# Patient Record
Sex: Male | Born: 1960 | Race: White | Hispanic: No | Marital: Married | Smoking: Never smoker
Health system: Southern US, Community
[De-identification: ages and names within clinical notes are randomized; demographics above are authoritative.]

## PROBLEM LIST (undated history)

## (undated) ENCOUNTER — Ambulatory Visit: Admission: EM | Payer: BC Managed Care – PPO | Source: Home / Self Care

## (undated) DIAGNOSIS — M533 Sacrococcygeal disorders, not elsewhere classified: Secondary | ICD-10-CM

## (undated) DIAGNOSIS — T8859XA Other complications of anesthesia, initial encounter: Secondary | ICD-10-CM

## (undated) DIAGNOSIS — E785 Hyperlipidemia, unspecified: Secondary | ICD-10-CM

## (undated) DIAGNOSIS — M545 Low back pain, unspecified: Secondary | ICD-10-CM

## (undated) DIAGNOSIS — M1990 Unspecified osteoarthritis, unspecified site: Secondary | ICD-10-CM

## (undated) DIAGNOSIS — I10 Essential (primary) hypertension: Secondary | ICD-10-CM

## (undated) DIAGNOSIS — K219 Gastro-esophageal reflux disease without esophagitis: Secondary | ICD-10-CM

## (undated) DIAGNOSIS — K760 Fatty (change of) liver, not elsewhere classified: Secondary | ICD-10-CM

## (undated) DIAGNOSIS — Z9889 Other specified postprocedural states: Secondary | ICD-10-CM

## (undated) HISTORY — PX: FOOT SURGERY: SHX648

## (undated) HISTORY — PX: JOINT REPLACEMENT: SHX530

## (undated) HISTORY — DX: Low back pain: M54.5

## (undated) HISTORY — DX: Gastro-esophageal reflux disease without esophagitis: K21.9

## (undated) HISTORY — DX: Sacrococcygeal disorders, not elsewhere classified: M53.3

## (undated) HISTORY — PX: WRIST SURGERY: SHX841

## (undated) HISTORY — PX: KNEE SURGERY: SHX244

## (undated) HISTORY — PX: COLONOSCOPY: SHX174

## (undated) HISTORY — DX: Hyperlipidemia, unspecified: E78.5

## (undated) HISTORY — DX: Fatty (change of) liver, not elsewhere classified: K76.0

## (undated) HISTORY — DX: Low back pain, unspecified: M54.50

---

## 2005-12-29 ENCOUNTER — Emergency Department: Payer: Self-pay | Admitting: Emergency Medicine

## 2005-12-29 ENCOUNTER — Other Ambulatory Visit: Payer: Self-pay

## 2006-01-26 ENCOUNTER — Emergency Department: Payer: Self-pay | Admitting: Emergency Medicine

## 2006-02-21 ENCOUNTER — Emergency Department: Payer: Self-pay | Admitting: Emergency Medicine

## 2006-02-22 ENCOUNTER — Emergency Department: Payer: Self-pay | Admitting: Emergency Medicine

## 2006-02-23 ENCOUNTER — Other Ambulatory Visit: Payer: Self-pay

## 2006-02-27 ENCOUNTER — Ambulatory Visit: Payer: Self-pay | Admitting: Orthopedic Surgery

## 2006-02-28 ENCOUNTER — Emergency Department: Payer: Self-pay | Admitting: Emergency Medicine

## 2006-03-01 ENCOUNTER — Emergency Department: Payer: Self-pay | Admitting: Emergency Medicine

## 2007-02-18 ENCOUNTER — Emergency Department: Payer: Self-pay | Admitting: Emergency Medicine

## 2007-05-07 ENCOUNTER — Ambulatory Visit: Payer: Self-pay | Admitting: Internal Medicine

## 2007-07-29 ENCOUNTER — Emergency Department: Payer: Self-pay | Admitting: Emergency Medicine

## 2007-08-02 ENCOUNTER — Emergency Department: Payer: Self-pay | Admitting: Emergency Medicine

## 2007-11-11 ENCOUNTER — Emergency Department: Payer: Self-pay | Admitting: Emergency Medicine

## 2007-12-04 ENCOUNTER — Ambulatory Visit: Payer: Self-pay | Admitting: Family Medicine

## 2008-01-10 ENCOUNTER — Emergency Department: Payer: Self-pay | Admitting: Emergency Medicine

## 2009-04-16 ENCOUNTER — Emergency Department (HOSPITAL_BASED_OUTPATIENT_CLINIC_OR_DEPARTMENT_OTHER): Admission: EM | Admit: 2009-04-16 | Discharge: 2009-04-16 | Payer: Self-pay | Admitting: Emergency Medicine

## 2009-04-19 ENCOUNTER — Emergency Department (HOSPITAL_COMMUNITY): Admission: EM | Admit: 2009-04-19 | Discharge: 2009-04-19 | Payer: Self-pay | Admitting: Emergency Medicine

## 2009-06-22 ENCOUNTER — Inpatient Hospital Stay (HOSPITAL_COMMUNITY): Admission: EM | Admit: 2009-06-22 | Discharge: 2009-06-24 | Payer: Self-pay | Admitting: Emergency Medicine

## 2009-06-22 ENCOUNTER — Ambulatory Visit: Payer: Self-pay | Admitting: Cardiology

## 2009-06-23 ENCOUNTER — Encounter (INDEPENDENT_AMBULATORY_CARE_PROVIDER_SITE_OTHER): Payer: Self-pay | Admitting: Internal Medicine

## 2009-06-23 ENCOUNTER — Ambulatory Visit: Payer: Self-pay | Admitting: Vascular Surgery

## 2010-01-17 ENCOUNTER — Emergency Department: Payer: Self-pay | Admitting: Emergency Medicine

## 2010-01-18 ENCOUNTER — Emergency Department: Payer: Self-pay | Admitting: Unknown Physician Specialty

## 2010-01-23 ENCOUNTER — Emergency Department: Payer: Self-pay | Admitting: Emergency Medicine

## 2010-05-16 ENCOUNTER — Ambulatory Visit: Payer: Self-pay | Admitting: Family Medicine

## 2010-06-24 ENCOUNTER — Ambulatory Visit: Payer: Self-pay | Admitting: Physician Assistant

## 2010-09-07 ENCOUNTER — Ambulatory Visit: Payer: Self-pay | Admitting: Family Medicine

## 2010-09-07 ENCOUNTER — Encounter
Admission: RE | Admit: 2010-09-07 | Discharge: 2010-09-07 | Payer: Self-pay | Source: Home / Self Care | Admitting: Family Medicine

## 2010-11-01 ENCOUNTER — Emergency Department (HOSPITAL_COMMUNITY)
Admission: EM | Admit: 2010-11-01 | Discharge: 2010-11-01 | Payer: Self-pay | Source: Home / Self Care | Admitting: Emergency Medicine

## 2010-11-03 ENCOUNTER — Ambulatory Visit
Admission: RE | Admit: 2010-11-03 | Discharge: 2010-11-03 | Payer: Self-pay | Source: Home / Self Care | Attending: Family Medicine | Admitting: Family Medicine

## 2010-11-07 LAB — CBC
HCT: 41.4 % (ref 39.0–52.0)
Hemoglobin: 14.3 g/dL (ref 13.0–17.0)
MCH: 32.3 pg (ref 26.0–34.0)
MCHC: 34.5 g/dL (ref 30.0–36.0)
MCV: 93.5 fL (ref 78.0–100.0)
Platelets: 223 10*3/uL (ref 150–400)
RBC: 4.43 MIL/uL (ref 4.22–5.81)
RDW: 12.9 % (ref 11.5–15.5)
WBC: 8.6 10*3/uL (ref 4.0–10.5)

## 2010-11-07 LAB — BASIC METABOLIC PANEL
BUN: 6 mg/dL (ref 6–23)
CO2: 28 mEq/L (ref 19–32)
Calcium: 10.2 mg/dL (ref 8.4–10.5)
Chloride: 103 mEq/L (ref 96–112)
Creatinine, Ser: 0.97 mg/dL (ref 0.4–1.5)
GFR calc Af Amer: >60 mL/min (ref >60)
GFR calc non Af Amer: >60 mL/min (ref >60)
Glucose, Bld: 98 mg/dL (ref 70–99)
Potassium: 3.7 mEq/L (ref 3.5–5.1)
Sodium: 139 mEq/L (ref 135–145)

## 2010-11-07 LAB — DIFFERENTIAL
Basophils Absolute: 0 10*3/uL (ref 0.0–0.1)
Basophils Relative: 0 % (ref 0–1)
Eosinophils Absolute: 0.1 10*3/uL (ref 0.0–0.7)
Eosinophils Relative: 1 % (ref 0–5)
Lymphocytes Relative: 22 % (ref 12–46)
Lymphs Abs: 1.9 10*3/uL (ref 0.7–4.0)
Monocytes Absolute: 0.6 10*3/uL (ref 0.1–1.0)
Monocytes Relative: 7 % (ref 3–12)
Neutro Abs: 6 10*3/uL (ref 1.7–7.7)
Neutrophils Relative %: 70 % (ref 43–77)

## 2010-11-07 LAB — RAPID URINE DRUG SCREEN, HOSP PERFORMED
Amphetamines: NOT DETECTED
Barbiturates: NOT DETECTED
Benzodiazepines: NOT DETECTED
Cocaine: NOT DETECTED
Opiates: NOT DETECTED
Tetrahydrocannabinol: NOT DETECTED

## 2010-11-07 LAB — ETHANOL: Alcohol, Ethyl (B): <5 mg/dL (ref 0–10)

## 2010-11-08 ENCOUNTER — Ambulatory Visit: Admit: 2010-11-08 | Payer: Self-pay | Admitting: Family Medicine

## 2011-01-02 ENCOUNTER — Ambulatory Visit (INDEPENDENT_AMBULATORY_CARE_PROVIDER_SITE_OTHER): Payer: BC Managed Care – PPO | Admitting: Family Medicine

## 2011-01-02 ENCOUNTER — Ambulatory Visit
Admission: RE | Admit: 2011-01-02 | Discharge: 2011-01-02 | Disposition: A | Discharge status: Home / Self Care | Payer: BC Managed Care – PPO | Source: Ambulatory Visit | Attending: Family Medicine | Admitting: Family Medicine

## 2011-01-02 ENCOUNTER — Other Ambulatory Visit: Payer: Self-pay | Admitting: Family Medicine

## 2011-01-02 DIAGNOSIS — R52 Pain, unspecified: Secondary | ICD-10-CM

## 2011-01-02 DIAGNOSIS — M549 Dorsalgia, unspecified: Secondary | ICD-10-CM

## 2011-01-05 ENCOUNTER — Ambulatory Visit: Payer: BC Managed Care – PPO | Admitting: Family Medicine

## 2011-01-13 ENCOUNTER — Ambulatory Visit (INDEPENDENT_AMBULATORY_CARE_PROVIDER_SITE_OTHER): Payer: BC Managed Care – PPO | Admitting: Family Medicine

## 2011-01-13 DIAGNOSIS — M25569 Pain in unspecified knee: Secondary | ICD-10-CM

## 2011-01-13 DIAGNOSIS — M25469 Effusion, unspecified knee: Secondary | ICD-10-CM

## 2011-01-27 LAB — GLUCOSE, CAPILLARY: Glucose-Capillary: 102 mg/dL — ABNORMAL HIGH (ref 70–99)

## 2011-01-27 LAB — LIPID PANEL
Cholesterol: 127 mg/dL (ref 0–200)
HDL: 34 mg/dL — ABNORMAL LOW (ref >39)
LDL Cholesterol: 82 mg/dL (ref 0–99)
Total CHOL/HDL Ratio: 3.7 RATIO
Triglycerides: 55 mg/dL (ref <150)
VLDL: 11 mg/dL (ref 0–40)

## 2011-01-28 LAB — COMPREHENSIVE METABOLIC PANEL
ALT: 17 U/L (ref 0–53)
AST: 18 U/L (ref 0–37)
Albumin: 3.8 g/dL (ref 3.5–5.2)
Alkaline Phosphatase: 79 U/L (ref 39–117)
BUN: 7 mg/dL (ref 6–23)
CO2: 29 mEq/L (ref 19–32)
Calcium: 9 mg/dL (ref 8.4–10.5)
Chloride: 104 mEq/L (ref 96–112)
Creatinine, Ser: 1.02 mg/dL (ref 0.4–1.5)
GFR calc Af Amer: >60 mL/min (ref >60)
GFR calc non Af Amer: >60 mL/min (ref >60)
Glucose, Bld: 79 mg/dL (ref 70–99)
Potassium: 3.8 mEq/L (ref 3.5–5.1)
Sodium: 139 mEq/L (ref 135–145)
Total Bilirubin: 0.8 mg/dL (ref 0.3–1.2)
Total Protein: 6.3 g/dL (ref 6.0–8.3)

## 2011-01-28 LAB — MAGNESIUM: Magnesium: 2.1 mg/dL (ref 1.5–2.5)

## 2011-01-28 LAB — GLUCOSE, CAPILLARY: Glucose-Capillary: 127 mg/dL — ABNORMAL HIGH (ref 70–99)

## 2011-01-28 LAB — POCT I-STAT, CHEM 8
BUN: 7 mg/dL (ref 6–23)
Calcium, Ion: 1.18 mmol/L (ref 1.12–1.32)
Chloride: 102 mEq/L (ref 96–112)
Creatinine, Ser: 0.9 mg/dL (ref 0.4–1.5)
Glucose, Bld: 106 mg/dL — ABNORMAL HIGH (ref 70–99)
HCT: 43 % (ref 39.0–52.0)
Hemoglobin: 14.6 g/dL (ref 13.0–17.0)
Potassium: 4.1 mEq/L (ref 3.5–5.1)
Sodium: 137 mEq/L (ref 135–145)
TCO2: 27 mmol/L (ref 0–100)

## 2011-01-28 LAB — APTT: aPTT: 29 seconds (ref 24–37)

## 2011-01-28 LAB — HEMOGLOBIN A1C
Hgb A1c MFr Bld: 5.3 % (ref 4.6–6.1)
Mean Plasma Glucose: 105 mg/dL

## 2011-01-28 LAB — PROTIME-INR
INR: 1.1 (ref 0.00–1.49)
Prothrombin Time: 14.2 seconds (ref 11.6–15.2)

## 2011-01-28 LAB — TSH: TSH: 1.225 u[IU]/mL (ref 0.350–4.500)

## 2011-01-28 LAB — PHOSPHORUS: Phosphorus: 3.9 mg/dL (ref 2.3–4.6)

## 2011-01-30 LAB — CBC
HCT: 43.1 % (ref 39.0–52.0)
Hemoglobin: 14.7 g/dL (ref 13.0–17.0)
MCHC: 34.2 g/dL (ref 30.0–36.0)
MCV: 97.3 fL (ref 78.0–100.0)
Platelets: 211 10*3/uL (ref 150–400)
RBC: 4.43 MIL/uL (ref 4.22–5.81)
RDW: 12.4 % (ref 11.5–15.5)
WBC: 6.1 10*3/uL (ref 4.0–10.5)

## 2011-01-30 LAB — BASIC METABOLIC PANEL
BUN: 9 mg/dL (ref 6–23)
CO2: 30 mEq/L (ref 19–32)
Calcium: 9.2 mg/dL (ref 8.4–10.5)
Chloride: 102 mEq/L (ref 96–112)
Creatinine, Ser: 1 mg/dL (ref 0.4–1.5)
GFR calc Af Amer: >60 mL/min (ref >60)
GFR calc non Af Amer: >60 mL/min (ref >60)
Glucose, Bld: 102 mg/dL — ABNORMAL HIGH (ref 70–99)
Potassium: 3.9 mEq/L (ref 3.5–5.1)
Sodium: 140 mEq/L (ref 135–145)

## 2011-01-30 LAB — POCT CARDIAC MARKERS
CKMB, poc: <1 ng/mL — ABNORMAL LOW (ref 1.0–8.0)
Myoglobin, poc: 56.5 ng/mL (ref 12–200)
Troponin i, poc: <0.05 ng/mL (ref 0.00–0.09)

## 2011-01-30 LAB — DIFFERENTIAL
Basophils Absolute: 0 10*3/uL (ref 0.0–0.1)
Basophils Relative: 0 % (ref 0–1)
Eosinophils Absolute: 0 10*3/uL (ref 0.0–0.7)
Eosinophils Relative: 1 % (ref 0–5)
Lymphocytes Relative: 17 % (ref 12–46)
Lymphs Abs: 1 10*3/uL (ref 0.7–4.0)
Monocytes Absolute: 0.4 10*3/uL (ref 0.1–1.0)
Monocytes Relative: 6 % (ref 3–12)
Neutro Abs: 4.7 10*3/uL (ref 1.7–7.7)
Neutrophils Relative %: 76 % (ref 43–77)

## 2011-02-03 ENCOUNTER — Ambulatory Visit: Payer: BC Managed Care – PPO | Admitting: Family Medicine

## 2011-03-07 NOTE — Discharge Summary (Signed)
[name], [name]               ACCOUNT NO.:  [id number]   MEDICAL RECORD NO.:  [id number]          PATIENT TYPE:  INP   LOCATION:  3018                         FACILITY:  [facility]   PHYSICIAN:  Ladell Pier, M.D.   DATE OF BIRTH:  [birth year]   DATE OF ADMISSION:  06/22/2009  DATE OF DISCHARGE:  06/24/2009                               DISCHARGE SUMMARY   DISCHARGE DIAGNOSES:  1. Right-sided numbness and weakness, question of transient ischemic      attack.  2. Migraine headaches.  3. Gastroesophageal reflux disease.  4. 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   DISCHARGE MEDICATIONS:  1. Aspirin 325 mg daily.  2. Nexium 40 mg daily.   FOLLOWUP APPOINTMENT:  The patient to follow up with Dr. Conley Rolls at [facility] in 1 week.   PROCEDURES:  None.   CONSULTANTS:  None.   DIAGNOSTIC STUDIES:  MRI/MRA of the brain that showed no acute infarct,  chronic infarct in the left cerebellum, the left external capsule, and  left parietal periventricular white matter.  MRA negative for cerebral  aneurysm.  CT scan of the head done on the September 1, showed no  evidence of cortical based acute infarct, age indeterminate white matter  ischemia at the left external capsule, and chronic left cerebellar  infarct.  Carotid Dopplers showed no ICA stenosis.  A 2-D echo showed  left ventricular systolic function normal, EF of 60-65%.   HISTORY OF PRESENT ILLNESS:  The patient is a 50 year old white male  with past medical history significant for migraine headaches, came into  the emergency room complaining of numbness of the hand and forearm,  which eventually progressed to the foot and leg with accompanying  slurred speech for 20-30 minutes.  He also had headache at the time  related to his usual migraine headaches.  He had some nausea, but no  vomiting.  No chest pain.   Please see admission note for remainder of  history.  Past medical history, family history, social history, meds,  allergies, review of systems per admission H&P.   PHYSICAL EXAMINATION ON DISCHARGE:  VITAL SIGNS:  Temperature 97.7,  pulse of 65, respirations 18, blood pressure 98/66, and pulse oximetry  97% on room air.  GENERAL:  The patient is sitting up in bed, does not seem to be in any  acute distress.  No complaints.  HEENT:  Head is normocephalic and atraumatic.  Pupils reactive to light.  Throat without erythema.  HEART:  Regular rate and rhythm.  LUNGS:  Clear bilaterally.  ABDOMEN:  Positive bowel sounds.  EXTREMITIES:  Without edema.  NEUROLOGIC:  Nonfocal.   HOSPITAL COURSE:  1. Question of TIA/migraines:  The patient was admitted to the      hospital telemetry bed.  He was started on aspirin.  He did have      MRI/MRA, 2-D echo, and carotid Dopplers done for routine stroke      workup.  They were all without any significant findings for acute      event.  The patient will be discharged on aspirin, and he will      follow up with his primary care physician within 1 week.  2. Migraine headaches:  I discussed with him to discuss with his      primary care physician if he does have frequent migraines that he      could be on something prophylactic.  3. GERD:  Continued him on the Nexium.   DISCHARGE LABORATORY DATA:  TSH 1.225.  Hemoglobin A1c 5.3.  Total  cholesterol 127, triglycerides of 55, HDL 34, and LDL 82.  Magnesium  2.1.  Sodium 139, potassium 3.8, chloride 104, CO2 29, glucose 79, BUN  7, and creatinine 1.02.  LFTs within normal limits.   Time spent with the patient and doing the discharge is approximately 45  minutes.      Ladell Pier, M.D.  Electronically Signed     NJ/MEDQ  D:  06/24/2009  T:  06/25/2009  Job:  [id number]   cc:   Dr. Conley Rolls

## 2011-03-07 NOTE — H&P (Signed)
[name], [name]               ACCOUNT NO.:  [id number]   MEDICAL RECORD NO.:  [id number]          PATIENT TYPE:  INP   LOCATION:  3018                         FACILITY:  [facility]   PHYSICIAN:  Marinda Elk, M.D.DATE OF BIRTH:  12/21/1960   DATE OF ADMISSION:  06/22/2009  DATE OF DISCHARGE:                              HISTORY & PHYSICAL   PRIMARY CARE PHYSICIAN:  Ladell Pier, MD   HISTORY OF PRESENT ILLNESS:  This 50 year old with past medical history  of migraine that comes in for spots on his right eye that started around  10:30 then he relayed having numbness of the hand and forearm, which  eventually spread to the foot and leg with accompanying slurred speech  for 20 or 30 minutes.  He also had a headache with this, which he  relates is different from his usual migraine headaches.  This is the  first time this has ever happened.  He relates positive nausea, but no  vomiting.  He denies chest pain, shortness of breath, double vision,  dyspnea on exertion, lightheadedness, or dizziness.   ALLERGIES:  He has no known drug allergies.   PAST MEDICAL HISTORY:  Only significant for migraine headaches and GERD.   MEDICATION:  He takes Nexium 40 mg daily.   SOCIAL HISTORY:  He denies smoking, lives in [place], drinks beer 1-2  socially, denies any drugs.   FAMILY HISTORY:  Mother has hypertension, COPD, and heart failure.  His  father died at the age of 103 of PE.   REVIEW OF SYSTEMS:  GENERAL:  He denies fatigue or generalized weakness.  HEENT:  He denies any yellow eyes.  NECK:  He denies thyromegaly.  CARDIOVASCULAR:  No palpitations.  No chest pain.  LUNGS:  No cough, no  bloody sputum.  ABDOMEN:  No diarrhea, constipation, or blood in stools.  EXTREMITIES:  No deformities.  NEUROLOGIC:  No double vision, no  dizziness, no lightheadedness.   PHYSICAL EXAMINATION:  VITAL SIGNS:  Temperature 97.7, heart rate of 66,  respiration 18, blood pressure 127/73, he was  sating 98% on 2 L.  GENERAL APPEARANCE:  He appears in no acute distress.  HEENT:  Eyes are anicteric.  No pallor.  NECK:  No thyromegaly, slight bruit on the left carotid.  CARDIOVASCULAR:  He has a regular rate and rhythm.  Positive S1 and S2.  No murmurs, rubs, or gallops.  LUNGS:  Clear to auscultation with good air movement.  ABDOMEN:  Positive bowel sounds, nontender, nondistended, and soft.  EXTREMITIES:  Positive pulses.  No edema.  SKIN:  No significant changes.  NEURO:  He is alert and oriented x3, coherent, and fluent language.  Pupils are equally round and reactive to light.  No extraocular  movements.  Anicteric.  Intact throughout.  There is no facial droop.  He has 5/5 strength in all 4 extremities and symmetrically there is no  tongue deviation.  There is no slurred speech.  There is no footdrop.  Proprioception is intact.  Reflex is 2+ in upper and lower extremities.  Finger-to-nose is normal.   LABORATORY  DATA:  On admission, he has a white count of 6.1, hemoglobin  of 14.7, platelets was 211, ANC of 4.7, MCV of 97.  Sodium 140,  potassium 3.9, chloride 102, bicarb of 30, glucose of 102, BUN of 9,  creatinine 1.0, calcium of 9.2.  First set of point-of-cardiac care  markers, CK-MB less than 1.0, troponin is less than 0.05, myoglobin of  56.  Second set of point-of-cardiac care markers, CK-MB is less than 1,  troponin is less than 0.05, myoglobin of 64.  CT scan of the head shows  no evidence of cortical-based acute infarct, age indeterminate, white  matter ischemic on the left external capsule, and chronic left basilar  infarction.   ASSESSMENT/PLAN:  1. Right-sided numbness and weakness most likely secondary to      transient ischemic attack, now resolved.  We will admit the patient      to telemetry to monitor for any arrhythmia.  We will check a      swallowing evaluation.  We will start a statin and aspirin.  We      will check carotid Doppler, get fasting  lipid panel, TSH, PT/INR,      and a hemoglobin A1c.  The patient has a history of migraine, but      this he has never had episodes like this in the past.  He does      describe that the headache that he is having now is different from      migraine headaches he has had in the past.  We will also check UDS.  2. Migraine headaches.  We will continue him on Tylenol.  We will      monitor for any worsening of the headaches.  3. Prophylaxis.  We will start him on Protonix once a day daily and we      will start him also on Lovenox prophylactically.      Marinda Elk, M.D.  Electronically Signed     AF/MEDQ  D:  06/22/2009  T:  06/23/2009  Job:  [id number]   cc:   Ladell Pier, M.D.

## 2011-08-12 ENCOUNTER — Emergency Department (HOSPITAL_COMMUNITY)
Admission: EM | Admit: 2011-08-12 | Discharge: 2011-08-12 | Disposition: A | Discharge status: Home / Self Care | Payer: Self-pay | Attending: Emergency Medicine | Admitting: Emergency Medicine

## 2011-08-12 DIAGNOSIS — Y9269 Other specified industrial and construction area as the place of occurrence of the external cause: Secondary | ICD-10-CM | POA: Insufficient documentation

## 2011-08-12 DIAGNOSIS — X500XXA Overexertion from strenuous movement or load, initial encounter: Secondary | ICD-10-CM | POA: Insufficient documentation

## 2011-08-12 DIAGNOSIS — S335XXA Sprain of ligaments of lumbar spine, initial encounter: Secondary | ICD-10-CM | POA: Insufficient documentation

## 2011-08-12 LAB — URINALYSIS, ROUTINE W REFLEX MICROSCOPIC
Bilirubin Urine: NEGATIVE
Hgb urine dipstick: NEGATIVE
Nitrite: NEGATIVE
Specific Gravity, Urine: 1.016 (ref 1.005–1.030)
pH: 7.5 (ref 5.0–8.0)

## 2011-08-17 ENCOUNTER — Encounter: Payer: Self-pay | Admitting: Family Medicine

## 2011-10-27 ENCOUNTER — Ambulatory Visit: Payer: Self-pay | Admitting: Family Medicine

## 2011-12-28 DIAGNOSIS — H53009 Unspecified amblyopia, unspecified eye: Secondary | ICD-10-CM | POA: Insufficient documentation

## 2011-12-28 DIAGNOSIS — H35053 Retinal neovascularization, unspecified, bilateral: Secondary | ICD-10-CM | POA: Insufficient documentation

## 2012-02-09 DIAGNOSIS — H35359 Cystoid macular degeneration, unspecified eye: Secondary | ICD-10-CM | POA: Insufficient documentation

## 2012-04-18 ENCOUNTER — Ambulatory Visit (INDEPENDENT_AMBULATORY_CARE_PROVIDER_SITE_OTHER): Payer: BC Managed Care – PPO | Admitting: Family Medicine

## 2012-04-18 ENCOUNTER — Encounter: Payer: Self-pay | Admitting: Family Medicine

## 2012-04-18 VITALS — BP 110/70 | HR 71 | Temp 98.5°F | Wt 180.0 lb

## 2012-04-18 DIAGNOSIS — M545 Low back pain: Secondary | ICD-10-CM

## 2012-04-18 DIAGNOSIS — J029 Acute pharyngitis, unspecified: Secondary | ICD-10-CM

## 2012-04-18 DIAGNOSIS — K219 Gastro-esophageal reflux disease without esophagitis: Secondary | ICD-10-CM

## 2012-04-18 LAB — POCT RAPID STREP A (OFFICE): Rapid Strep A Screen: NEGATIVE

## 2012-04-18 MED ORDER — OMEPRAZOLE 40 MG PO CPDR: 40.0000 mg | DELAYED_RELEASE_CAPSULE | Freq: Every day | ORAL | Status: DC

## 2012-04-18 NOTE — Progress Notes (Signed)
  Subjective:    Patient ID: [name], male    DOB: 11/19/1960, 51 y.o.   MRN: [id number]  HPI For the last 2 days he has had malaise as well as fever, chills, sore throat and dry cough. No earache. He does not smoke and has no allergies. He also has a three-month history of difficulty with back pain especially with lifting and with bending . No numbness, tingling or weakness. He also has a history of reflux disease and was given Nexium in the past as a sample. He would like a refill since he is having reflux symptoms again.   Review of Systems     Objective:   Physical Exam alert and in no distress. Tympanic membranes and canals are normal. Throat is clear. Tonsils are normal. Neck is supple without adenopathy or thyromegaly. Cardiac exam shows a regular sinus rhythm without murmurs or gallops. Lungs are clear to auscultation. Back examination shows normal lumbar curve, no tenderness palpation. Normal motion of his back. No tenderness over SI joints. Strep test negative     Assessment & Plan:   1. Sore throat  POCT rapid strep A  2. Low back pain    3. GERD (gastroesophageal reflux disease)  omeprazole (PRILOSEC) 40 MG capsule   supportive care for treatment of his strep since it was negative. I discussed proper back care with him including lifting standing and sitting. I will also give him Prilosec to help cut down on the cost.

## 2012-04-18 NOTE — Patient Instructions (Signed)
Advil 4 tablets 3 times per day for the aches and pains and also for your back.if no better on one week ,call for an appt

## 2012-04-19 ENCOUNTER — Encounter: Payer: Self-pay | Admitting: Family Medicine

## 2012-04-19 ENCOUNTER — Other Ambulatory Visit: Payer: Self-pay | Admitting: Family Medicine

## 2012-04-19 NOTE — Telephone Encounter (Signed)
PLS SIGN OFF ON RX 

## 2012-04-21 MED ORDER — HYDROCOD POLST-CHLORPHEN POLST 10-8 MG/5ML PO LQCR: 5.0000 mL | Freq: Two times a day (BID) | ORAL | Status: DC

## 2012-06-17 ENCOUNTER — Encounter: Payer: Self-pay | Admitting: Internal Medicine

## 2012-06-21 ENCOUNTER — Encounter: Payer: BC Managed Care – PPO | Admitting: Family Medicine

## 2012-06-28 ENCOUNTER — Encounter: Payer: BC Managed Care – PPO | Admitting: Family Medicine

## 2012-08-02 ENCOUNTER — Encounter: Payer: BC Managed Care – PPO | Admitting: Family Medicine

## 2012-09-13 ENCOUNTER — Emergency Department (HOSPITAL_COMMUNITY): Payer: Self-pay

## 2012-09-13 ENCOUNTER — Encounter (HOSPITAL_COMMUNITY): Payer: Self-pay | Admitting: Emergency Medicine

## 2012-09-13 ENCOUNTER — Emergency Department (HOSPITAL_COMMUNITY)
Admission: EM | Admit: 2012-09-13 | Discharge: 2012-09-13 | Disposition: A | Discharge status: Home / Self Care | Payer: Self-pay | Attending: Emergency Medicine | Admitting: Emergency Medicine

## 2012-09-13 DIAGNOSIS — R002 Palpitations: Secondary | ICD-10-CM | POA: Insufficient documentation

## 2012-09-13 DIAGNOSIS — R0602 Shortness of breath: Secondary | ICD-10-CM | POA: Insufficient documentation

## 2012-09-13 DIAGNOSIS — E785 Hyperlipidemia, unspecified: Secondary | ICD-10-CM | POA: Insufficient documentation

## 2012-09-13 LAB — COMPREHENSIVE METABOLIC PANEL
ALT: 14 U/L (ref 0–53)
AST: 20 U/L (ref 0–37)
Alkaline Phosphatase: 84 U/L (ref 39–117)
CO2: 26 mEq/L (ref 19–32)
Calcium: 9.5 mg/dL (ref 8.4–10.5)
Chloride: 101 mEq/L (ref 96–112)
GFR calc Af Amer: >90 mL/min (ref >90)
GFR calc non Af Amer: >90 mL/min (ref >90)
Glucose, Bld: 95 mg/dL (ref 70–99)
Potassium: 3.5 mEq/L (ref 3.5–5.1)
Sodium: 138 mEq/L (ref 135–145)
Total Bilirubin: 0.6 mg/dL (ref 0.3–1.2)

## 2012-09-13 LAB — CBC WITH DIFFERENTIAL/PLATELET
Eosinophils Relative: 1 % (ref 0–5)
Lymphocytes Relative: 18 % (ref 12–46)
Lymphs Abs: 1.1 10*3/uL (ref 0.7–4.0)
MCV: 89.4 fL (ref 78.0–100.0)
Neutro Abs: 4.2 10*3/uL (ref 1.7–7.7)
Platelets: 213 10*3/uL (ref 150–400)
RBC: 4.05 MIL/uL — ABNORMAL LOW (ref 4.22–5.81)
WBC: 5.8 10*3/uL (ref 4.0–10.5)

## 2012-09-13 NOTE — ED Notes (Signed)
ZOX:[id number]<AV> Expected date:09/13/12<BR> Expected time: 6:35 PM<BR> Means of arrival:Ambulance<BR> Comments:<BR> 70yoM/Diarrhea

## 2012-09-13 NOTE — ED Provider Notes (Signed)
History     CSN: [id number]  Arrival date & time 09/13/12  1830   First MD Initiated Contact with Patient 09/13/12 1851      Chief Complaint  Patient presents with  . Chest Pain  . Shortness of Breath    (Consider location/radiation/quality/duration/timing/severity/associated sxs/prior treatment) HPI  Patient reports about 4:50 PM tonight when he was getting off work until he got here about 6:30 PM he felt like his heart was fluttering or skipping beats. He denies feeling like his heart was racing. He denies chest pain but states he did have some mild shortness of breath. He denies field feeling dizzy, lightheaded, diaphoresis, nausea, vomiting. He states he did feel like he was flushed. He states he had this once before about 20 years ago and was told it was from stress.  Patient reports he is under a lot of stress. 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 He states everybody wants things done "right now". He also states he has social services taking him to court because they state he is not taking care of his 65 year old mother who is living with him and his wife. He reports his wife stays home to take care of his mother all day. He states he's very concerned about this.  Patient denies history of coronary artery disease  PCP Dr. Susann Givens  Past Medical History  Diagnosis Date  . Hyperlipidemia     Past Surgical History  Procedure Date  . 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 Asthma Mother   . Arthritis Mother   . Diabetes Maternal Aunt    no coronary artery disease  History  Substance Use Topics  . Smoking status: Never Smoker   . Smokeless tobacco: Never Used  . Alcohol Use: Yes     Comment: socially   Employed Lives at home Lives with spouse   Review of Systems  All other systems reviewed and are negative.    Allergies  Codeine  and Percocet  Home Medications   Current Outpatient Rx  Name  Route  Sig  Dispense  Refill  . ACETAMINOPHEN 325 MG PO TABS   Oral   Take 650 mg by mouth every 6 (six) hours as needed. Headache         . OMEPRAZOLE 40 MG PO CPDR   Oral   Take 40 mg by mouth as needed. Acid reflux           BP 127/78  Pulse 66  Temp 98 F (36.7 C) (Oral)  Resp 18  Ht 6\' 1"  (1.854 m)  Wt 180 lb (81.647 kg)  BMI 23.75 kg/m2  SpO2 97%  Vital signs normal    Physical Exam  Nursing note and vitals reviewed. Constitutional: He is oriented to person, place, and time. He appears well-developed and well-nourished.  Non-toxic appearance. He does not appear ill. No distress.  HENT:  Head: Normocephalic and atraumatic.  Right Ear: External ear normal.  Left Ear: External ear normal.  Nose: Nose normal. No mucosal edema or rhinorrhea.  Mouth/Throat: Oropharynx is clear and moist and mucous membranes are normal. No dental abscesses or uvula swelling.  Eyes: Conjunctivae normal and EOM are normal. Pupils are equal, round, and reactive to light.  Neck: Normal range of motion and full passive range of motion without pain. Neck supple.  Cardiovascular: Normal rate, regular rhythm and normal heart sounds.  Exam reveals no gallop and no friction rub.   No murmur heard. Pulmonary/Chest: Effort normal and breath sounds normal. No respiratory distress. He has no wheezes. He has no rhonchi. He has no rales. He exhibits no tenderness and no crepitus.  Abdominal: Soft. Normal appearance and bowel sounds are normal. He exhibits no distension. There is no tenderness. There is no rebound and no guarding.  Musculoskeletal: Normal range of motion. He exhibits no edema and no tenderness.       Moves all extremities well.   Neurological: He is alert and oriented to person, place, and time. He has normal strength. No cranial nerve deficit.  Skin: Skin is warm, dry and intact. No rash noted. No erythema. No pallor.    Psychiatric: His speech is normal and behavior is normal. His mood appears not anxious.       Slightly anxious    ED Course  Procedures (including critical care time)  Patient remains in normal sinus rhythm without ectopy. He denies having any more symptoms while he is in the ED.    Results for orders placed during the hospital encounter of 09/13/12  CBC WITH DIFFERENTIAL      Component Value Range   WBC 5.8  4.0 - 10.5 K/uL   RBC 4.05 (*) 4.22 - 5.81 MIL/uL   Hemoglobin 13.0  13.0 - 17.0 g/dL   HCT 16.1 (*) 09.6 - 04.5 %   MCV 89.4  78.0 - 100.0 fL   MCH 32.1  26.0 - 34.0 pg   MCHC 35.9  30.0 - 36.0 g/dL   RDW 40.9  81.1 - 91.4 %   Platelets 213  150 - 400 K/uL   Neutrophils Relative 73  43 - 77 %   Neutro Abs 4.2  1.7 - 7.7 K/uL   Lymphocytes Relative 18  12 - 46 %   Lymphs Abs 1.1  0.7 - 4.0 K/uL   Monocytes Relative 8  3 - 12 %   Monocytes Absolute 0.5  0.1 - 1.0 K/uL   Eosinophils Relative 1  0 - 5 %   Eosinophils Absolute 0.0  0.0 - 0.7 K/uL   Basophils Relative 0  0 - 1 %   Basophils Absolute 0.0  0.0 - 0.1 K/uL  COMPREHENSIVE METABOLIC PANEL      Component Value Range   Sodium 138  135 - 145 mEq/L   Potassium 3.5  3.5 - 5.1 mEq/L   Chloride 101  96 - 112 mEq/L   CO2 26  19 - 32 mEq/L   Glucose, Bld 95  70 - 99 mg/dL   BUN 9  6 - 23 mg/dL   Creatinine, Ser 7.82  0.50 - 1.35 mg/dL   Calcium 9.5  8.4 - 95.6 mg/dL   Total Protein 6.8  6.0 - 8.3 g/dL   Albumin 4.2  3.5 - 5.2 g/dL   AST 20  0 - 37 U/L   ALT 14  0 - 53 U/L   Alkaline Phosphatase 84  39 - 117 U/L   Total Bilirubin 0.6  0.3 - 1.2 mg/dL   GFR calc non Af Amer >90  >90 mL/min   GFR calc Af Amer >90  >90 mL/min  TROPONIN I      Component Value Range   Troponin I <0.30  <0.30 ng/mL  MAGNESIUM      Component Value Range   Magnesium 2.0  1.5 - 2.5  mg/dL   Laboratory interpretation all normal    Dg Chest 2 View  09/13/2012  *RADIOLOGY REPORT*  Clinical Data:  Palpitations and shortness of  breath.  CHEST - 2 VIEW  Comparison: None  Findings: The heart size and mediastinal contours are within normal limits.  Both lungs are clear.  The visualized skeletal structures are unremarkable.  IMPRESSION: No active disease.   Original Report Authenticated By: Irish Lack, M.D.     Date: 09/13/2012  Rate: 67  Rhythm: normal sinus rhythm  QRS Axis: normal  Intervals: normal  ST/T Wave abnormalities: normal  Conduction Disutrbances:none  Narrative Interpretation:   Old EKG Reviewed: unchanged from 06/22/2009    1. Palpitations    Plan discharge  Devoria Albe, MD, FACEP    MDM           Ward Givens, MD 09/13/12 2039

## 2012-11-10 ENCOUNTER — Emergency Department (HOSPITAL_COMMUNITY)
Admission: EM | Admit: 2012-11-10 | Discharge: 2012-11-10 | Disposition: A | Discharge status: Home / Self Care | Payer: Self-pay | Attending: Emergency Medicine | Admitting: Emergency Medicine

## 2012-11-10 ENCOUNTER — Encounter (HOSPITAL_COMMUNITY): Payer: Self-pay

## 2012-11-10 DIAGNOSIS — R197 Diarrhea, unspecified: Secondary | ICD-10-CM | POA: Insufficient documentation

## 2012-11-10 DIAGNOSIS — K529 Noninfective gastroenteritis and colitis, unspecified: Secondary | ICD-10-CM

## 2012-11-10 DIAGNOSIS — Z8639 Personal history of other endocrine, nutritional and metabolic disease: Secondary | ICD-10-CM | POA: Insufficient documentation

## 2012-11-10 DIAGNOSIS — K5289 Other specified noninfective gastroenteritis and colitis: Secondary | ICD-10-CM | POA: Insufficient documentation

## 2012-11-10 DIAGNOSIS — Z862 Personal history of diseases of the blood and blood-forming organs and certain disorders involving the immune mechanism: Secondary | ICD-10-CM | POA: Insufficient documentation

## 2012-11-10 LAB — CBC WITH DIFFERENTIAL/PLATELET
Basophils Absolute: 0 10*3/uL (ref 0.0–0.1)
Eosinophils Absolute: 0.1 10*3/uL (ref 0.0–0.7)
Eosinophils Relative: 1 % (ref 0–5)
HCT: 41.4 % (ref 39.0–52.0)
MCH: 32.7 pg (ref 26.0–34.0)
MCHC: 35.7 g/dL (ref 30.0–36.0)
MCV: 91.6 fL (ref 78.0–100.0)
Monocytes Absolute: 0.9 10*3/uL (ref 0.1–1.0)
Platelets: 178 10*3/uL (ref 150–400)
RDW: 13.3 % (ref 11.5–15.5)

## 2012-11-10 LAB — COMPREHENSIVE METABOLIC PANEL
ALT: 14 U/L (ref 0–53)
AST: 17 U/L (ref 0–37)
Calcium: 9.1 mg/dL (ref 8.4–10.5)
Creatinine, Ser: 1.01 mg/dL (ref 0.50–1.35)
GFR calc Af Amer: >90 mL/min (ref >90)
GFR calc non Af Amer: 83 mL/min — ABNORMAL LOW (ref >90)
Sodium: 134 mEq/L — ABNORMAL LOW (ref 135–145)
Total Protein: 7.2 g/dL (ref 6.0–8.3)

## 2012-11-10 LAB — URINALYSIS, MICROSCOPIC ONLY
Hgb urine dipstick: NEGATIVE
Leukocytes, UA: NEGATIVE
Protein, ur: NEGATIVE mg/dL
Specific Gravity, Urine: 1.005 (ref 1.005–1.030)
Urobilinogen, UA: 2 mg/dL — ABNORMAL HIGH (ref 0.0–1.0)

## 2012-11-10 MED ORDER — LOPERAMIDE HCL 2 MG PO CAPS
2.0000 mg | ORAL_CAPSULE | ORAL | Status: DC | PRN
  Administered 2012-11-10: 2 mg via ORAL
  Filled 2012-11-10: qty 1

## 2012-11-10 MED ORDER — DIPHENOXYLATE-ATROPINE 2.5-0.025 MG PO TABS: 1.0000 | ORAL_TABLET | Freq: Four times a day (QID) | ORAL | Status: DC | PRN

## 2012-11-10 MED ORDER — ONDANSETRON 4 MG PO TBDP
8.0000 mg | ORAL_TABLET | Freq: Once | ORAL | Status: AC
  Administered 2012-11-10: 8 mg via ORAL
  Filled 2012-11-10: qty 2

## 2012-11-10 MED ORDER — ONDANSETRON 8 MG PO TBDP: 8.0000 mg | ORAL_TABLET | Freq: Three times a day (TID) | ORAL | Status: DC | PRN

## 2012-11-10 NOTE — ED Notes (Signed)
Pt states he ate [employer] last night and his sandwich tasted weird.  Pt reports that shortly after he began vomiting.  Pt states he has vomited until he can't vomit anymore.  Pt also c/o stomach cramping.

## 2012-11-10 NOTE — ED Provider Notes (Signed)
History     CSN: [id number]  Arrival date & time 11/10/12  1559   First MD Initiated Contact with Patient 11/10/12 1633      Chief Complaint  Patient presents with  . Emesis    (Consider location/radiation/quality/duration/timing/severity/associated sxs/prior treatment) Patient is a 52 y.o. male presenting with vomiting. The history is provided by the patient.  Emesis    patient here with vomiting watery diarrhea which started after he had a sandwich last night. No fever but did have some chills. No urinary symptoms. His emesis was nonbilious and there was no blood in stool. Symptoms have improved and now mostly consist of diffuse abdominal cramping. Denies being weak or lightheaded. States his appetite is slowly returning. No medications taken prior to arrival and nothing makes her symptoms worse  Past Medical History  Diagnosis Date  . Hyperlipidemia     Past Surgical History  Procedure Date  . 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 Asthma Mother   . Arthritis Mother   . Diabetes Maternal Aunt     History  Substance Use Topics  . Smoking status: Never Smoker   . Smokeless tobacco: Never Used  . Alcohol Use: Yes     Comment: socially      Review of Systems  Gastrointestinal: Positive for vomiting.  All other systems reviewed and are negative.    Allergies  Codeine and Percocet  Home Medications  No current outpatient prescriptions on file.  BP 121/64  Pulse 81  Temp 98.8 F (37.1 C) (Oral)  Resp 14  SpO2 98%  Physical Exam  Nursing note and vitals reviewed. Constitutional: He is oriented to person, place, and time. He appears well-developed and well-nourished.  Non-toxic appearance. No distress.  HENT:  Head: Normocephalic and atraumatic.  Eyes: Conjunctivae normal, EOM and lids are normal. Pupils are equal, round, and reactive to light.  Neck: Normal range of motion. Neck  supple. No tracheal deviation present. No mass present.  Cardiovascular: Normal rate, regular rhythm and normal heart sounds.  Exam reveals no gallop.   No murmur heard. Pulmonary/Chest: Effort normal and breath sounds normal. No stridor. No respiratory distress. He has no decreased breath sounds. He has no wheezes. He has no rhonchi. He has no rales.  Abdominal: Soft. Normal appearance and bowel sounds are normal. He exhibits no distension. There is no tenderness. There is no rigidity, no rebound, no guarding and no CVA tenderness.  Musculoskeletal: Normal range of motion. He exhibits no edema and no tenderness.  Neurological: He is alert and oriented to person, place, and time. He has normal strength. No cranial nerve deficit or sensory deficit. GCS eye subscore is 4. GCS verbal subscore is 5. GCS motor subscore is 6.  Skin: Skin is warm and dry. No abrasion and no rash noted.  Psychiatric: He has a normal mood and affect. His speech is normal and behavior is normal.    ED Course  Procedures (including critical care time)  Labs Reviewed  CBC WITH DIFFERENTIAL - Abnormal; Notable for the following:    Neutrophils Relative 82 (*)     Lymphocytes Relative 7 (*)     Lymphs Abs 0.6 (*)     All other components within normal limits  COMPREHENSIVE METABOLIC PANEL  LIPASE, BLOOD  URINALYSIS, MICROSCOPIC ONLY   No results found.   No diagnosis found.    MDM  Patient  given Lomotil and Zofran and feels better. Suspect that he has gastroenteritis. No concern for acute abdominal process at this time he is stable for discharge to        Toy Baker, MD 11/10/12 1745

## 2013-01-28 ENCOUNTER — Emergency Department (HOSPITAL_COMMUNITY)
Admission: EM | Admit: 2013-01-28 | Discharge: 2013-01-28 | Disposition: A | Discharge status: Home / Self Care | Payer: Managed Care, Other (non HMO) | Attending: Emergency Medicine | Admitting: Emergency Medicine

## 2013-01-28 ENCOUNTER — Encounter (HOSPITAL_COMMUNITY): Payer: Self-pay

## 2013-01-28 ENCOUNTER — Emergency Department (HOSPITAL_COMMUNITY): Payer: Managed Care, Other (non HMO)

## 2013-01-28 DIAGNOSIS — R52 Pain, unspecified: Secondary | ICD-10-CM | POA: Insufficient documentation

## 2013-01-28 DIAGNOSIS — J3489 Other specified disorders of nose and nasal sinuses: Secondary | ICD-10-CM | POA: Insufficient documentation

## 2013-01-28 DIAGNOSIS — J029 Acute pharyngitis, unspecified: Secondary | ICD-10-CM | POA: Insufficient documentation

## 2013-01-28 DIAGNOSIS — R6889 Other general symptoms and signs: Secondary | ICD-10-CM | POA: Insufficient documentation

## 2013-01-28 DIAGNOSIS — Z8639 Personal history of other endocrine, nutritional and metabolic disease: Secondary | ICD-10-CM | POA: Insufficient documentation

## 2013-01-28 DIAGNOSIS — R059 Cough, unspecified: Secondary | ICD-10-CM | POA: Insufficient documentation

## 2013-01-28 DIAGNOSIS — R112 Nausea with vomiting, unspecified: Secondary | ICD-10-CM | POA: Insufficient documentation

## 2013-01-28 DIAGNOSIS — J069 Acute upper respiratory infection, unspecified: Secondary | ICD-10-CM | POA: Insufficient documentation

## 2013-01-28 DIAGNOSIS — Z87891 Personal history of nicotine dependence: Secondary | ICD-10-CM | POA: Insufficient documentation

## 2013-01-28 DIAGNOSIS — Z862 Personal history of diseases of the blood and blood-forming organs and certain disorders involving the immune mechanism: Secondary | ICD-10-CM | POA: Insufficient documentation

## 2013-01-28 DIAGNOSIS — R05 Cough: Secondary | ICD-10-CM | POA: Insufficient documentation

## 2013-01-28 LAB — RAPID STREP SCREEN (MED CTR MEBANE ONLY): Streptococcus, Group A Screen (Direct): NEGATIVE

## 2013-01-28 MED ORDER — MUCINEX DM 30-600 MG PO TB12: 1.0000 | ORAL_TABLET | Freq: Two times a day (BID) | ORAL | Status: DC

## 2013-01-28 MED ORDER — CYCLOBENZAPRINE HCL 5 MG PO TABS: 5.0000 mg | ORAL_TABLET | Freq: Three times a day (TID) | ORAL | Status: DC | PRN

## 2013-01-28 MED ORDER — ONDANSETRON HCL 4 MG PO TABS: ORAL_TABLET | ORAL | Status: DC

## 2013-01-28 NOTE — ED Notes (Signed)
Pt with fever, cough, vomiting and body aches since Sunday.  Taking meds at home with no relief.  + phlegm cough.

## 2013-01-28 NOTE — ED Provider Notes (Signed)
History     CSN: [id number]  Arrival date & time 01/28/13  2005   First MD Initiated Contact with Patient 01/28/13 2018      Chief Complaint  Patient presents with  . Fever  . Cough    (Consider location/radiation/quality/duration/timing/severity/associated sxs/prior treatment) HPI  Patient reports 2 nights ago he felt like he was coming down with a cold. However he had no URI symptoms. He states yesterday he started having nausea and vomiting. He states he was having cold chills and body aches. His temperature got up to 101. He states he's had no appetite today however he was able to keep 2 crackers and drink some soup. He states he is having rhinorrhea, sneezing, sore throat, and cough with green sputum production now. He denies diarrhea.  PCP Dr. Susann Givens  Past Medical History  Diagnosis Date  . Hyperlipidemia     Past Surgical History  Procedure Laterality Date  . 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 Knee surgery      bilateral  . Foot surgery    . Wrist surgery      Family History  Problem Relation Age of Onset  . Asthma Mother   . Arthritis Mother   . Diabetes Maternal Aunt     History  Substance Use Topics  . Smoking status: Former Games developer  . Smokeless tobacco: Never Used  . Alcohol Use: Yes     Comment: socially   Lives at home Lives with spouse employed   Review of Systems  All other systems reviewed and are negative.    Allergies  Codeine and Percocet  Home Medications   Current Outpatient Rx  Name  Route  Sig  Dispense  Refill  . DM-Doxylamine-Acetaminophen (NYQUIL COLD & FLU PO)   Oral   Take 30 mLs by mouth daily as needed (flu-like symptoms).         Marland Kitchen ibuprofen (ADVIL,MOTRIN) 200 MG tablet   Oral   Take 400 mg by mouth every 6 (six) hours as needed for pain.           BP 125/73  Pulse 73  Temp(Src) 98.2 F (36.8 C) (Oral)  Resp 18  Ht 6\' 1"  (1.854 m)  Wt 180 lb (81.647 kg)  BMI 23.75 kg/m2   SpO2 98%  Vital signs normal    Physical Exam  Nursing note and vitals reviewed. Constitutional: He is oriented to person, place, and time. He appears well-developed and well-nourished.  Non-toxic appearance. He does not appear ill. No distress.  HENT:  Head: Normocephalic and atraumatic.  Right Ear: External ear normal.  Left Ear: External ear normal.  Nose: Nose normal. No mucosal edema or rhinorrhea.  Mouth/Throat: Oropharynx is clear and moist and mucous membranes are normal. No dental abscesses or edematous.  Mild redness of hypopharynx  Eyes: Conjunctivae and EOM are normal. Pupils are equal, round, and reactive to light.  Neck: Normal range of motion and full passive range of motion without pain. Neck supple.  Cardiovascular: Normal rate, regular rhythm and normal heart sounds.  Exam reveals no gallop and no friction rub.   No murmur heard. Pulmonary/Chest: Effort normal and breath sounds normal. No respiratory distress. He has no wheezes. He has no rhonchi. He has no rales. He exhibits no tenderness and no crepitus.  Abdominal: Soft. Normal appearance and bowel sounds are normal. He exhibits no distension. There is no tenderness. There is no rebound and no guarding.  Musculoskeletal: Normal range of motion. He exhibits no edema and no tenderness.  Moves all extremities well.   Neurological: He is alert and oriented to person, place, and time. He has normal strength. No cranial nerve deficit.  Skin: Skin is warm, dry and intact. No rash noted. No erythema. No pallor.  Psychiatric: He has a normal mood and affect. His speech is normal and behavior is normal. His mood appears not anxious.    ED Course  Procedures (including critical care time)  Orthostatic vital signs are negative  Results for orders placed during the hospital encounter of 01/28/13  RAPID STREP SCREEN      Result Value Range   Streptococcus, Group A Screen (Direct) NEGATIVE  NEGATIVE    Dg Chest 2  View  01/28/2013  *RADIOLOGY REPORT*  Clinical Data: Fever, congestion, cough  CHEST - 2 VIEW  Comparison:  09/13/2012  Findings:  The heart size and mediastinal contours are within normal limits.  Both lungs are clear.  The visualized skeletal structures are unremarkable.  IMPRESSION: No active cardiopulmonary disease.   Original Report Authenticated By: Judie Petit. Shick, M.D.      1. Nausea and vomiting   2. Acute URI    New Prescriptions   CYCLOBENZAPRINE (FLEXERIL) 5 MG TABLET    Take 1 tablet (5 mg total) by mouth 3 (three) times daily as needed (muscle soreness).   DEXTROMETHORPHAN-GUAIFENESIN (MUCINEX DM) 30-600 MG TB12    Take 1 tablet by mouth 2 (two) times daily.   ONDANSETRON (ZOFRAN) 4 MG TABLET    Take 1 or 2 po Q 8 hrs for nausea or vomiting    Plan discharge  Devoria Albe, MD, Armando Gang    MDM          Ward Givens, MD 01/28/13 2206

## 2013-04-25 ENCOUNTER — Encounter (HOSPITAL_COMMUNITY): Payer: Self-pay | Admitting: *Deleted

## 2013-04-25 ENCOUNTER — Emergency Department (INDEPENDENT_AMBULATORY_CARE_PROVIDER_SITE_OTHER)
Admission: EM | Admit: 2013-04-25 | Discharge: 2013-04-25 | Disposition: A | Discharge status: Home / Self Care | Payer: Self-pay | Source: Home / Self Care

## 2013-04-25 DIAGNOSIS — H5789 Other specified disorders of eye and adnexa: Secondary | ICD-10-CM

## 2013-04-25 DIAGNOSIS — S0502XA Injury of conjunctiva and corneal abrasion without foreign body, left eye, initial encounter: Secondary | ICD-10-CM

## 2013-04-25 DIAGNOSIS — H579 Unspecified disorder of eye and adnexa: Secondary | ICD-10-CM

## 2013-04-25 DIAGNOSIS — S0500XA Injury of conjunctiva and corneal abrasion without foreign body, unspecified eye, initial encounter: Secondary | ICD-10-CM

## 2013-04-25 MED ORDER — ERYTHROMYCIN 5 MG/GM OP OINT: TOPICAL_OINTMENT | OPHTHALMIC | Status: DC

## 2013-04-25 MED ORDER — TETRACAINE HCL 0.5 % OP SOLN
OPHTHALMIC | Status: AC
  Filled 2013-04-25: qty 2

## 2013-04-25 NOTE — ED Provider Notes (Signed)
History    CSN: [id number] Arrival date & time 04/25/13  1040  First MD Initiated Contact with Patient 04/25/13 1132     Chief Complaint  Patient presents with  . Eye Problem    UDS required for WC case   (Consider location/radiation/quality/duration/timing/severity/associated sxs/prior Treatment) HPI  52 yo wm presents today with left eye pain.  States he was at work today and went to grab a trash back and felt something go into his eye. Thoroughly irrigated at eye wash station after this happened.  Had pain and some vision changes.  Eye watering.  Feels like something is scratching his eye.   No other injuries.   Hx of macular degeneration and is followed by a specialist in [place] area.   Past Medical History  Diagnosis Date  . Hyperlipidemia    Past Surgical History  Procedure Laterality Date  . 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 Knee surgery      bilateral  . Foot surgery    . Wrist surgery     Family History  Problem Relation Age of Onset  . Asthma Mother   . Arthritis Mother   . Diabetes Maternal Aunt    History  Substance Use Topics  . Smoking status: Former Games developer  . Smokeless tobacco: Never Used  . Alcohol Use: Yes     Comment: socially    Review of Systems  Constitutional: Negative.   HENT: Negative.   Eyes: Positive for pain, redness, itching and visual disturbance.  Cardiovascular: Negative.   Gastrointestinal: Negative.   Endocrine: Negative.   Genitourinary: Negative.   Musculoskeletal: Negative.   Skin: Negative.   Allergic/Immunologic: Negative.   Neurological: Negative.   Psychiatric/Behavioral: Negative.     Allergies  Codeine and Percocet  Home Medications   Current Outpatient Rx  Name  Route  Sig  Dispense  Refill  . cyclobenzaprine (FLEXERIL) 5 MG tablet   Oral   Take 1 tablet (5 mg total) by mouth 3 (three) times daily as needed (muscle soreness).   30 tablet   0   .  Dextromethorphan-Guaifenesin (MUCINEX DM) 30-600 MG TB12   Oral   Take 1 tablet by mouth 2 (two) times daily.   28 each   0   . DM-Doxylamine-Acetaminophen (NYQUIL COLD & FLU PO)   Oral   Take 30 mLs by mouth daily as needed (flu-like symptoms).         Marland Kitchen erythromycin ophthalmic ointment      Place a 1/2 inch ribbon of ointment into the left lower eyelid.   3.5 g   1   . ibuprofen (ADVIL,MOTRIN) 200 MG tablet   Oral   Take 400 mg by mouth every 6 (six) hours as needed for pain.         Marland Kitchen ondansetron (ZOFRAN) 4 MG tablet      Take 1 or 2 po Q 8 hrs for nausea or vomiting   8 tablet   0    BP 140/88  Pulse 72  Temp(Src) 98.6 F (37 C) (Oral)  Resp 18  SpO2 100% Physical Exam  Constitutional: He is oriented to person, place, and time. He appears well-developed and well-nourished.  Eyes: Right eye exhibits no discharge. Left eye exhibits no discharge.  Conjunctiva red.  Woods lamp exam performed.  No signs of corneal abrasion.  ? A small conjunctival scratch upper lid.    Neck: Normal range of motion.  Cardiovascular: Normal rate and regular rhythm.   Pulmonary/Chest: Effort normal and breath sounds normal.  Musculoskeletal: Normal range of motion.  Neurological: He is alert and oriented to person, place, and time.  Skin: Skin is warm and dry.  Psychiatric: He has a normal mood and affect.    ED Course  Procedures (including critical care time) Labs Reviewed - No data to display No results found. 1. Eye irritation   2. Conjunctival abrasion, left, initial encounter     MDM  Patient given script for erythromycin ointment.  Can also use otc systane drops as directed.   If not better next week, he must f/u with his eye specialist for recheck.  If worsens over the weekend must return here or go to the ED.  Voices understanding of instructions and treatment plan.    Meds ordered this encounter  Medications  . erythromycin ophthalmic ointment    Sig: Place a 1/2  inch ribbon of ointment into the left lower eyelid.    Dispense:  3.5 g    Refill:  1    Zonia Kief, PA-C 04/25/13 1228

## 2013-04-25 NOTE — ED Notes (Signed)
This  Writer  And   [place]  With           pts   supivisor   Elvis Coil      Who  requsts  A  Drug  Screen  But  Did  Not  Send  Paperwork and  occ  Health  Is  Closed         He says  Send  Pt  Back  To  Work  And       He  Will follow it up

## 2013-04-25 NOTE — ED Notes (Signed)
Pt  Reports   He  Was  At  Work  Today     When  He  Opened  A  Box  And  Possibly  A  piece  Of trash  Or  Something  Got  In  His    l  Eye   - the  Eye    Feels  Gritty         And  Has  Been tearing  Some   -           The  Eye  Is   Somewhat  Red   And     Is  Watery     And  Tearing       He  Normally    Wears  Glasses          His  Vision is  20 /50  r    With  Glasses  And  20  /70       l  With  Some  Blurring  Noted

## 2013-04-26 NOTE — ED Provider Notes (Signed)
Medical screening examination/treatment/procedure(s) were performed by non-physician practitioner and as supervising physician I was immediately available for consultation/collaboration.   MORENO-COLL,Ashlan Dignan; MD  Jaylaa Gallion Moreno-Coll, MD 04/26/13 1523 

## 2013-06-19 ENCOUNTER — Emergency Department: Payer: Self-pay | Admitting: Emergency Medicine

## 2013-08-03 ENCOUNTER — Emergency Department: Payer: Self-pay | Admitting: Emergency Medicine

## 2013-08-14 ENCOUNTER — Telehealth: Payer: Self-pay | Admitting: Internal Medicine

## 2013-08-14 NOTE — Telephone Encounter (Signed)
Let him know that I recommend that he come in now to get an evaluation

## 2013-08-14 NOTE — Telephone Encounter (Signed)
Pt states that he has some heart fluttering, some SOB but he said he called somewhere and they told him it could be due to his acid reflux and asked if he was taking his nexium. He said no he wasn't cause he does not have insurance for another 2 weeks and didn't have the money. He said he would come in to see you but due to no insurance he can't. He is scheduled for a physical in November hoping he will have his insurance by then.

## 2013-08-18 NOTE — Telephone Encounter (Signed)
PT SAID HE IS DOING BETTER AND HE STILL HAS NO INS. YET SO HE WILL WAIT AND COME IN FOR PHYSICAL

## 2013-09-04 ENCOUNTER — Encounter: Payer: Self-pay | Admitting: Family Medicine

## 2013-09-30 ENCOUNTER — Encounter: Payer: Self-pay | Admitting: Family Medicine

## 2013-10-07 ENCOUNTER — Encounter: Payer: Self-pay | Admitting: Family Medicine

## 2013-12-23 ENCOUNTER — Emergency Department: Payer: Self-pay | Admitting: Emergency Medicine

## 2014-01-01 ENCOUNTER — Emergency Department: Payer: Self-pay | Admitting: Emergency Medicine

## 2014-01-01 LAB — COMPREHENSIVE METABOLIC PANEL
ALBUMIN: 4.1 g/dL (ref 3.4–5.0)
ALK PHOS: 95 U/L
ALT: 46 U/L (ref 12–78)
ANION GAP: 4 — AB (ref 7–16)
BUN: 8 mg/dL (ref 7–18)
Bilirubin,Total: 1 mg/dL (ref 0.2–1.0)
CALCIUM: 8.4 mg/dL — AB (ref 8.5–10.1)
CREATININE: 0.83 mg/dL (ref 0.60–1.30)
Chloride: 105 mmol/L (ref 98–107)
Co2: 27 mmol/L (ref 21–32)
Glucose: 90 mg/dL (ref 65–99)
OSMOLALITY: 270 (ref 275–301)
Potassium: 3.8 mmol/L (ref 3.5–5.1)
SGOT(AST): 26 U/L (ref 15–37)
SODIUM: 136 mmol/L (ref 136–145)
TOTAL PROTEIN: 7.2 g/dL (ref 6.4–8.2)

## 2014-01-01 LAB — CBC WITH DIFFERENTIAL/PLATELET
BASOS ABS: 0 10*3/uL (ref 0.0–0.1)
Basophil %: 0.2 %
Eosinophil #: 0 10*3/uL (ref 0.0–0.7)
Eosinophil %: 0.3 %
HCT: 39.2 % — ABNORMAL LOW (ref 40.0–52.0)
HGB: 13.4 g/dL (ref 13.0–18.0)
LYMPHS ABS: 1.3 10*3/uL (ref 1.0–3.6)
Lymphocyte %: 17.2 %
MCH: 31.2 pg (ref 26.0–34.0)
MCHC: 34.2 g/dL (ref 32.0–36.0)
MCV: 91 fL (ref 80–100)
Monocyte #: 0.6 x10 3/mm (ref 0.2–1.0)
Monocyte %: 8.1 %
NEUTROS ABS: 5.6 10*3/uL (ref 1.4–6.5)
Neutrophil %: 74.2 %
Platelet: 277 10*3/uL (ref 150–440)
RBC: 4.28 10*6/uL — AB (ref 4.40–5.90)
RDW: 12.7 % (ref 11.5–14.5)
WBC: 7.6 10*3/uL (ref 3.8–10.6)

## 2014-01-06 LAB — CULTURE, BLOOD (SINGLE)

## 2014-01-19 ENCOUNTER — Emergency Department: Payer: Self-pay | Admitting: Emergency Medicine

## 2014-04-29 ENCOUNTER — Emergency Department: Payer: Self-pay | Admitting: Internal Medicine

## 2014-06-18 ENCOUNTER — Encounter: Payer: Self-pay | Admitting: Family Medicine

## 2014-06-18 ENCOUNTER — Ambulatory Visit (INDEPENDENT_AMBULATORY_CARE_PROVIDER_SITE_OTHER): Payer: 59 | Admitting: Family Medicine

## 2014-06-18 VITALS — BP 122/68 | HR 76 | Ht 71.5 in | Wt 175.0 lb

## 2014-06-18 DIAGNOSIS — Z1211 Encounter for screening for malignant neoplasm of colon: Secondary | ICD-10-CM

## 2014-06-18 DIAGNOSIS — Z0000 Encounter for general adult medical examination without abnormal findings: Secondary | ICD-10-CM

## 2014-06-18 DIAGNOSIS — K219 Gastro-esophageal reflux disease without esophagitis: Secondary | ICD-10-CM

## 2014-06-18 LAB — LIPID PANEL
CHOL/HDL RATIO: 3.4 ratio
Cholesterol: 168 mg/dL (ref 0–200)
HDL: 50 mg/dL (ref >39)
LDL CALC: 109 mg/dL — AB (ref 0–99)
Triglycerides: 47 mg/dL (ref <150)
VLDL: 9 mg/dL (ref 0–40)

## 2014-06-18 LAB — CBC WITH DIFFERENTIAL/PLATELET
BASOS ABS: 0 10*3/uL (ref 0.0–0.1)
Basophils Relative: 0 % (ref 0–1)
Eosinophils Absolute: 0.1 10*3/uL (ref 0.0–0.7)
Eosinophils Relative: 1 % (ref 0–5)
HCT: 38.2 % — ABNORMAL LOW (ref 39.0–52.0)
Hemoglobin: 13.7 g/dL (ref 13.0–17.0)
LYMPHS PCT: 21 % (ref 12–46)
Lymphs Abs: 1.1 10*3/uL (ref 0.7–4.0)
MCH: 32 pg (ref 26.0–34.0)
MCHC: 35.9 g/dL (ref 30.0–36.0)
MCV: 89.3 fL (ref 78.0–100.0)
Monocytes Absolute: 0.5 10*3/uL (ref 0.1–1.0)
Monocytes Relative: 10 % (ref 3–12)
NEUTROS ABS: 3.6 10*3/uL (ref 1.7–7.7)
NEUTROS PCT: 68 % (ref 43–77)
PLATELETS: 198 10*3/uL (ref 150–400)
RBC: 4.28 MIL/uL (ref 4.22–5.81)
RDW: 13.4 % (ref 11.5–15.5)
WBC: 5.3 10*3/uL (ref 4.0–10.5)

## 2014-06-18 LAB — COMPREHENSIVE METABOLIC PANEL
ALBUMIN: 4.5 g/dL (ref 3.5–5.2)
ALK PHOS: 72 U/L (ref 39–117)
ALT: 14 U/L (ref 0–53)
AST: 18 U/L (ref 0–37)
BUN: 12 mg/dL (ref 6–23)
CALCIUM: 9.1 mg/dL (ref 8.4–10.5)
CHLORIDE: 103 meq/L (ref 96–112)
CO2: 27 mEq/L (ref 19–32)
Creat: 0.94 mg/dL (ref 0.50–1.35)
Glucose, Bld: 93 mg/dL (ref 70–99)
POTASSIUM: 3.8 meq/L (ref 3.5–5.3)
Sodium: 139 mEq/L (ref 135–145)
Total Bilirubin: 1 mg/dL (ref 0.2–1.2)
Total Protein: 6.7 g/dL (ref 6.0–8.3)

## 2014-06-18 MED ORDER — ESOMEPRAZOLE MAGNESIUM 40 MG PO CPDR: 40.0000 mg | DELAYED_RELEASE_CAPSULE | Freq: Every day | ORAL | Status: DC

## 2014-06-18 NOTE — Progress Notes (Signed)
   Subjective:    Patient ID: [name], male    DOB: 04-10-1961, 53 y.o.   MRN: [id number]  HPI He is here for complete examination. He does complain of fatigue and states that he has been under stress due to the death of his mother one year ago and an uncle recently. This has a psychological impact on him and now he feels isolated. His marriage is going well. He has had some slight knee pain he dates to previous surgery with anterior cruciate ligament repair. He has had some difficulty with reflux symptoms and has been using Zantac. In the past he had used Nexium like start back on. He does have a history of macular degeneration and is being followed for this. He has no other concerns or complaints. Social and family history were reviewed. His immunizations were reviewed. He refused tetanus and flu.   Review of Systems  All other systems reviewed and are negative.      Objective:   Physical Exam BP 122/68  Pulse 76  Ht 5' 11.5" (1.816 m)  Wt 175 lb (79.379 kg)  BMI 24.07 kg/m2  SpO2 98%  General Appearance:    Alert, cooperative, no distress, appears stated age  Head:    Normocephalic, without obvious abnormality, atraumatic  Eyes:    PERRL, conjunctiva/corneas clear, EOM's intact, fundi    benign  Ears:    Normal TM's and external ear canals  Nose:   Nares normal, mucosa normal, no drainage or sinus   tenderness  Throat:   Lips, mucosa, and tongue normal; teeth and gums normal  Neck:   Supple, no lymphadenopathy;  thyroid:  no   enlargement/tenderness/nodules; no carotid   bruit or JVD  Back:    Spine nontender, no curvature, ROM normal, no CVA     tenderness  Lungs:     Clear to auscultation bilaterally without wheezes, rales or     ronchi; respirations unlabored  Chest Wall:    No tenderness or deformity   Heart:    Regular rate and rhythm, S1 and S2 normal, no murmur, rub   or gallop  Breast Exam:    No chest wall tenderness, masses or gynecomastia  Abdomen:     Soft,  non-tender, nondistended, normoactive bowel sounds,    no masses, no hepatosplenomegaly        Extremities:   No clubbing, cyanosis or edema  Pulses:   2+ and symmetric all extremities  Skin:   Skin color, texture, turgor normal, no rashes or lesions  Lymph nodes:   Cervical, supraclavicular, and axillary nodes normal  Neurologic:   CNII-XII intact, normal strength, sensation and gait; reflexes 2+ and symmetric throughout          Psych:   Normal mood, affect, hygiene and grooming.          Assessment & Plan:  Routine general medical examination at a health care facility - Plan: CBC with Differential, Comprehensive metabolic panel, Lipid panel  Gastroesophageal reflux disease without esophagitis - Plan: esomeprazole (NEXIUM) 40 MG capsule  Special screening for malignant neoplasm of colon - Plan: Ambulatory referral to Gastroenterology  command he return for immunization update. He will be sent for routine colonoscopy. Discussed the fatigue that he is having and explained that this is not unusual. He is going to the grieving process.

## 2014-07-06 ENCOUNTER — Emergency Department: Payer: Self-pay | Admitting: Emergency Medicine

## 2014-08-10 ENCOUNTER — Emergency Department: Payer: Self-pay | Admitting: Emergency Medicine

## 2014-08-10 LAB — COMPREHENSIVE METABOLIC PANEL
ALBUMIN: 3.7 g/dL (ref 3.4–5.0)
ALK PHOS: 92 U/L
AST: 23 U/L (ref 15–37)
Anion Gap: 10 (ref 7–16)
BILIRUBIN TOTAL: 0.6 mg/dL (ref 0.2–1.0)
BUN: 9 mg/dL (ref 7–18)
Calcium, Total: 8.4 mg/dL — ABNORMAL LOW (ref 8.5–10.1)
Chloride: 105 mmol/L (ref 98–107)
Co2: 24 mmol/L (ref 21–32)
Creatinine: 1.05 mg/dL (ref 0.60–1.30)
GLUCOSE: 111 mg/dL — AB (ref 65–99)
OSMOLALITY: 277 (ref 275–301)
POTASSIUM: 3.5 mmol/L (ref 3.5–5.1)
SGPT (ALT): 25 U/L
Sodium: 139 mmol/L (ref 136–145)
Total Protein: 6.8 g/dL (ref 6.4–8.2)

## 2014-08-10 LAB — CBC WITH DIFFERENTIAL/PLATELET
BASOS ABS: 0 10*3/uL (ref 0.0–0.1)
Basophil %: 0.3 %
EOS PCT: 1.3 %
Eosinophil #: 0.1 10*3/uL (ref 0.0–0.7)
HCT: 39.3 % — ABNORMAL LOW (ref 40.0–52.0)
HGB: 12.8 g/dL — AB (ref 13.0–18.0)
LYMPHS ABS: 1.6 10*3/uL (ref 1.0–3.6)
LYMPHS PCT: 23 %
MCH: 31 pg (ref 26.0–34.0)
MCHC: 32.5 g/dL (ref 32.0–36.0)
MCV: 95 fL (ref 80–100)
Monocyte #: 0.6 x10 3/mm (ref 0.2–1.0)
Monocyte %: 8.9 %
NEUTROS ABS: 4.6 10*3/uL (ref 1.4–6.5)
Neutrophil %: 66.5 %
PLATELETS: 189 10*3/uL (ref 150–440)
RBC: 4.12 10*6/uL — AB (ref 4.40–5.90)
RDW: 13.1 % (ref 11.5–14.5)
WBC: 6.9 10*3/uL (ref 3.8–10.6)

## 2014-08-10 LAB — URINALYSIS, COMPLETE
Bacteria: NONE SEEN
Bilirubin,UR: NEGATIVE
Blood: NEGATIVE
Glucose,UR: NEGATIVE mg/dL (ref 0–75)
KETONE: NEGATIVE
LEUKOCYTE ESTERASE: NEGATIVE
Nitrite: NEGATIVE
PH: 6 (ref 4.5–8.0)
Protein: NEGATIVE
RBC,UR: NONE SEEN /HPF (ref 0–5)
SPECIFIC GRAVITY: 1.009 (ref 1.003–1.030)
SQUAMOUS EPITHELIAL: NONE SEEN
WBC UR: NONE SEEN /HPF (ref 0–5)

## 2014-08-10 LAB — LIPASE, BLOOD: LIPASE: 165 U/L (ref 73–393)

## 2014-08-20 ENCOUNTER — Telehealth: Payer: Self-pay | Admitting: Family Medicine

## 2014-08-20 ENCOUNTER — Ambulatory Visit: Payer: 59 | Admitting: Family Medicine

## 2014-08-20 NOTE — Telephone Encounter (Signed)
He called stating that he had a fever and was feeling quite bad and was driving home apparently got a ticket. He was concerned over whether the fever could contribute to his being a little out of sorts and night said that that certainly possible.

## 2014-08-20 NOTE — Telephone Encounter (Signed)
Pt called @ 3:47 stating he was still at his eye appt where he needed to get an injection in his eye so he did not want to cancel that appt. He cant make his ER FU appt today bo requesting that Dr Susann GivensLalonde call him regarding his concerns about his issues he had yesterday.

## 2014-10-09 ENCOUNTER — Telehealth: Payer: Self-pay | Admitting: Family Medicine

## 2014-10-09 NOTE — Telephone Encounter (Signed)
Requesting another med to substitute the Nexium because pt says it is too expensive. Is there a generic med or something pt can try?

## 2014-10-09 NOTE — Telephone Encounter (Signed)
Pt informed and verbalized understanding

## 2014-10-09 NOTE — Telephone Encounter (Signed)
Prilosec 

## 2014-10-26 ENCOUNTER — Encounter: Payer: 59 | Admitting: Medical

## 2014-11-09 ENCOUNTER — Encounter: Payer: Self-pay | Admitting: Family Medicine

## 2015-03-02 DIAGNOSIS — H44002 Unspecified purulent endophthalmitis, left eye: Secondary | ICD-10-CM | POA: Insufficient documentation

## 2015-03-11 ENCOUNTER — Emergency Department
Admission: EM | Admit: 2015-03-11 | Discharge: 2015-03-11 | Disposition: A | Discharge status: Home / Self Care | Payer: No Typology Code available for payment source | Attending: Emergency Medicine | Admitting: Emergency Medicine

## 2015-03-11 ENCOUNTER — Encounter: Payer: Self-pay | Admitting: *Deleted

## 2015-03-11 DIAGNOSIS — Y998 Other external cause status: Secondary | ICD-10-CM | POA: Insufficient documentation

## 2015-03-11 DIAGNOSIS — S30861A Insect bite (nonvenomous) of abdominal wall, initial encounter: Secondary | ICD-10-CM | POA: Diagnosis present

## 2015-03-11 DIAGNOSIS — Y9289 Other specified places as the place of occurrence of the external cause: Secondary | ICD-10-CM | POA: Diagnosis not present

## 2015-03-11 DIAGNOSIS — Z79899 Other long term (current) drug therapy: Secondary | ICD-10-CM | POA: Diagnosis not present

## 2015-03-11 DIAGNOSIS — W57XXXA Bitten or stung by nonvenomous insect and other nonvenomous arthropods, initial encounter: Secondary | ICD-10-CM | POA: Insufficient documentation

## 2015-03-11 DIAGNOSIS — Y9389 Activity, other specified: Secondary | ICD-10-CM | POA: Insufficient documentation

## 2015-03-11 MED ORDER — DOXYCYCLINE HYCLATE 100 MG PO TABS: 100.0000 mg | ORAL_TABLET | Freq: Once | ORAL | Status: AC

## 2015-03-11 MED ORDER — DOXYCYCLINE HYCLATE 100 MG PO TABS: 100.0000 mg | ORAL_TABLET | Freq: Two times a day (BID) | ORAL | Status: DC

## 2015-03-11 MED ORDER — DOXYCYCLINE HYCLATE 100 MG PO TABS
ORAL_TABLET | ORAL | Status: AC
  Administered 2015-03-11: 100 mg via ORAL
  Filled 2015-03-11: qty 1

## 2015-03-11 NOTE — ED Notes (Signed)
Pt alert and in NAD at time of d/c 

## 2015-03-11 NOTE — ED Provider Notes (Signed)
[name]  ____________________________________________  Time seen: 3:40 AM  I have reviewed the triage vital signs and the nursing notes.   HISTORY  Chief Complaint Tick Removal     HPI [name] is a 54 y.o. male presents with tick to the abdomen 1-1/2 weeks. No fever no chills no nausea no vomitingand no joint pains    Past Medical History  Diagnosis Date  . Hyperlipidemia     There are no active problems to display for this patient.   Past Surgical History  Procedure Laterality Date  . 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 Knee surgery      bilateral  . Foot surgery    . Wrist surgery      Current Outpatient Rx  Name  Route  Sig  Dispense  Refill  . doxycycline (VIBRA-TABS) 100 MG tablet   Oral   Take 1 tablet (100 mg total) by mouth 2 (two) times daily.   28 tablet   0   . esomeprazole (NEXIUM) 40 MG capsule   Oral   Take 1 capsule (40 mg total) by mouth daily.   90 capsule   3   . ibuprofen (ADVIL,MOTRIN) 200 MG tablet   Oral   Take 400 mg by mouth every 6 (six) hours as needed for pain.           Allergies Codeine and Percocet  Family History  Problem Relation Age of Onset  . Asthma Mother   . Arthritis Mother   . Diabetes Maternal Aunt     Social History History  Substance Use Topics  . Smoking status: Never Smoker   . Smokeless tobacco: Never Used  . Alcohol Use: Yes     Comment: socially    Review of Systems  Constitutional: Negative for fever. Eyes: Negative for visual changes. ENT: Negative for sore throat. Cardiovascular: Negative for chest pain. Respiratory: Negative for shortness of breath. Gastrointestinal: Negative for abdominal pain, vomiting and diarrhea. Genitourinary: Negative for dysuria. Musculoskeletal: Negative for back pain. Skin: Tick on the abdomen Neurological: Negative for headaches, focal weakness or  numbness.   10-point ROS otherwise negative.  ____________________________________________   PHYSICAL EXAM:  VITAL SIGNS: ED Triage Vitals  Enc Vitals Group     BP 03/11/15 0142 142/62 mmHg     Pulse Rate 03/11/15 0142 78     Resp 03/11/15 0142 20     Temp 03/11/15 0142 97.6 F (36.4 C)     Temp Source 03/11/15 0142 Oral     SpO2 03/11/15 0142 99 %     Weight 03/11/15 0142 174 lb (78.926 kg)     Height 03/11/15 0142 5\' 11"  (1.803 m)     Head Cir --      Peak Flow --      Pain Score --      Pain Loc --      Pain Edu? --      Excl. in GC? --      Constitutional: Alert and oriented. Well appearing and in no distress. Eyes: Conjunctivae are normal. PERRL. Normal extraocular movements. ENT   Head: Normocephalic and atraumatic.   Nose: No congestion/rhinnorhea.   Mouth/Throat: Mucous membranes are moist.   Neck: No stridor. Hematological/Lymphatic/Immunilogical: No cervical lymphadenopathy. Cardiovascular: Normal rate, regular rhythm. Normal and symmetric distal pulses are present in all extremities. No murmurs, rubs, or gallops. Respiratory: Normal respiratory effort without tachypnea  nor retractions. Breath sounds are clear and equal bilaterally. No wheezes/rales/rhonchi. Gastrointestinal: Soft and nontender. No distention. There is no CVA tenderness. Genitourinary: deferred Musculoskeletal: Nontender with normal range of motion in all extremities. No joint effusions.  No lower extremity tenderness nor edema. Neurologic:  Normal speech and language. No gross focal neurologic deficits are appreciated. Speech is normal.  Skin:  Single tic noted on the lateral aspect of the patient's abdomen with surrounding erythema Psychiatric: Mood and affect are normal. Speech and behavior are normal. Patient exhibits appropriate insight and judgment.  ____________________________________________     PROCEDURES  Procedure(s) performed: Tick was removed from the left  lower quadrant of the abdomen with tweezers.    ____________________________________________   INITIAL IMPRESSION / ASSESSMENT AND PLAN / ED COURSE  Pertinent labs & imaging results that were available during my care of the patient were reviewed by me and considered in my medical decision making (see chart for details).  Patient presents with tick to the abdomen and was removed successfully. Patient given doxycycline due to surrounding erythema and the likelihood for Lyme disease.  ____________________________________________   FINAL CLINICAL IMPRESSION(S) / ED DIAGNOSES  Final diagnoses:  Tick bite of abdomen, initial encounter      Darci Currentandolph N Johnmark Geiger, MD 03/11/15 [phone]

## 2015-03-11 NOTE — Discharge Instructions (Signed)
Tick Bite Information Ticks are insects that attach themselves to the skin and draw blood for food. There are various types of ticks. Common types include wood ticks and deer ticks. Most ticks live in shrubs and grassy areas. Ticks can climb onto your body when you make contact with leaves or grass where the tick is waiting. The most common places on the body for ticks to attach themselves are the scalp, neck, armpits, waist, and groin. Most tick bites are harmless, but sometimes ticks carry germs that cause diseases. These germs can be spread to a person during the tick's feeding process. The chance of a disease spreading through a tick bite depends on:   The type of tick.  Time of year.   How long the tick is attached.   Geographic location.  HOW CAN YOU PREVENT TICK BITES? Take these steps to help prevent tick bites when you are outdoors:  Wear protective clothing. Long sleeves and long pants are best.   Wear white clothes so you can see ticks more easily.  Tuck your pant legs into your socks.   If walking on a trail, stay in the middle of the trail to avoid brushing against bushes.  Avoid walking through areas with long grass.  Put insect repellent on all exposed skin and along boot tops, pant legs, and sleeve cuffs.   Check clothing, hair, and skin repeatedly and before going inside.   Brush off any ticks that are not attached.  Take a shower or bath as soon as possible after being outdoors.  WHAT IS THE PROPER WAY TO REMOVE A TICK? Ticks should be removed as soon as possible to help prevent diseases caused by tick bites. 1. If latex gloves are available, put them on before trying to remove a tick.  2. Using fine-point tweezers, grasp the tick as close to the skin as possible. You may also use curved forceps or a tick removal tool. Grasp the tick as close to its head as possible. Avoid grasping the tick on its body. 3. Pull gently with steady upward pressure until  the tick lets go. Do not twist the tick or jerk it suddenly. This may break off the tick's head or mouth parts. 4. Do not squeeze or crush the tick's body. This could force disease-carrying fluids from the tick into your body.  5. After the tick is removed, wash the bite area and your hands with soap and water or other disinfectant such as alcohol. 6. Apply a small amount of antiseptic cream or ointment to the bite site.  7. Wash and disinfect any instruments that were used.  Do not try to remove a tick by applying a hot match, petroleum jelly, or fingernail polish to the tick. These methods do not work and may increase the chances of disease being spread from the tick bite.  WHEN SHOULD YOU SEEK MEDICAL CARE? Contact your health care provider if you are unable to remove a tick from your skin or if a part of the tick breaks off and is stuck in the skin.  After a tick bite, you need to be aware of signs and symptoms that could be related to diseases spread by ticks. Contact your health care provider if you develop any of the following in the days or weeks after the tick bite:  Unexplained fever.  Rash. A circular rash that appears days or weeks after the tick bite may indicate the possibility of Lyme disease. The rash may resemble   a target with a bull's-eye and may occur at a different part of your body than the tick bite.  Redness and swelling in the area of the tick bite.   Tender, swollen lymph glands.   Diarrhea.   Weight loss.   Cough.   Fatigue.   Muscle, joint, or bone pain.   Abdominal pain.   Headache.   Lethargy or a change in your level of consciousness.  Difficulty walking or moving your legs.   Numbness in the legs.   Paralysis.  Shortness of breath.   Confusion.   Repeated vomiting.  Document Released: 10/06/2000 Document Revised: 07/30/2013 Document Reviewed: 03/19/2013 [employer] Patient Information 2015 [employer]. This information is  not intended to replace advice given to you by your health care provider. Make sure you discuss any questions you have with your health care provider.  

## 2015-03-11 NOTE — ED Notes (Addendum)
Pt has a tick on left side abdomen.  Pt states tick on abd for at least 1 week.  Area red.

## 2015-04-15 DIAGNOSIS — H3530 Unspecified macular degeneration: Secondary | ICD-10-CM | POA: Insufficient documentation

## 2015-07-10 ENCOUNTER — Emergency Department
Admission: EM | Admit: 2015-07-10 | Discharge: 2015-07-10 | Discharge status: Against Medical Advice | Payer: Medicaid - Out of State | Attending: Emergency Medicine | Admitting: Emergency Medicine

## 2015-07-10 DIAGNOSIS — Z792 Long term (current) use of antibiotics: Secondary | ICD-10-CM | POA: Diagnosis not present

## 2015-07-10 DIAGNOSIS — M545 Low back pain: Secondary | ICD-10-CM | POA: Diagnosis not present

## 2015-07-10 DIAGNOSIS — Z79899 Other long term (current) drug therapy: Secondary | ICD-10-CM | POA: Diagnosis not present

## 2015-07-10 LAB — URINALYSIS COMPLETE WITH MICROSCOPIC (ARMC ONLY)
BILIRUBIN URINE: NEGATIVE
Bacteria, UA: NONE SEEN
Glucose, UA: NEGATIVE mg/dL
Hgb urine dipstick: NEGATIVE
Leukocytes, UA: NEGATIVE
NITRITE: NEGATIVE
PH: 5 (ref 5.0–8.0)
Protein, ur: NEGATIVE mg/dL
SPECIFIC GRAVITY, URINE: 1.024 (ref 1.005–1.030)

## 2015-07-10 NOTE — ED Notes (Addendum)
Pt ambulatory to triage with no difficulty. Pt reports pain to his lower back for 7 to 8 weeks. 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 Pt reports over the last week having a feeling of not emptying his bladder and reports his urine has a strong odor to it.

## 2015-07-10 NOTE — ED Notes (Signed)
Pt to stat registration asking about the wait time; says he has to work in the morning and could just come back tomorrow nigh; explained to pt that according to wait times he would be second in line; understands medically someone may need to go ahead of him, otherwise he's second in line  pt verbalized understanding and says he is going to wait

## 2015-07-10 NOTE — ED Notes (Signed)
Pt reports back pain and foul smelling urine for several weeks; says he "called up here the other night" and was told he should probably be seen by a provider; pt says he did not come up that night to be seen but is here tonight as his pain has increased;

## 2015-07-12 ENCOUNTER — Emergency Department: Payer: No Typology Code available for payment source

## 2015-07-12 ENCOUNTER — Encounter: Payer: Self-pay | Admitting: Emergency Medicine

## 2015-07-12 ENCOUNTER — Emergency Department
Admission: EM | Admit: 2015-07-12 | Discharge: 2015-07-12 | Disposition: A | Discharge status: Home / Self Care | Payer: No Typology Code available for payment source | Attending: Student | Admitting: Student

## 2015-07-12 DIAGNOSIS — M5117 Intervertebral disc disorders with radiculopathy, lumbosacral region: Secondary | ICD-10-CM | POA: Insufficient documentation

## 2015-07-12 DIAGNOSIS — S39012A Strain of muscle, fascia and tendon of lower back, initial encounter: Secondary | ICD-10-CM | POA: Insufficient documentation

## 2015-07-12 DIAGNOSIS — Y9289 Other specified places as the place of occurrence of the external cause: Secondary | ICD-10-CM | POA: Insufficient documentation

## 2015-07-12 DIAGNOSIS — Z792 Long term (current) use of antibiotics: Secondary | ICD-10-CM | POA: Insufficient documentation

## 2015-07-12 DIAGNOSIS — X58XXXA Exposure to other specified factors, initial encounter: Secondary | ICD-10-CM | POA: Insufficient documentation

## 2015-07-12 DIAGNOSIS — M4726 Other spondylosis with radiculopathy, lumbar region: Secondary | ICD-10-CM

## 2015-07-12 DIAGNOSIS — E785 Hyperlipidemia, unspecified: Secondary | ICD-10-CM | POA: Insufficient documentation

## 2015-07-12 DIAGNOSIS — Y998 Other external cause status: Secondary | ICD-10-CM | POA: Insufficient documentation

## 2015-07-12 DIAGNOSIS — Y9389 Activity, other specified: Secondary | ICD-10-CM | POA: Insufficient documentation

## 2015-07-12 DIAGNOSIS — Z79899 Other long term (current) drug therapy: Secondary | ICD-10-CM | POA: Insufficient documentation

## 2015-07-12 LAB — URINALYSIS COMPLETE WITH MICROSCOPIC (ARMC ONLY)
Bacteria, UA: NONE SEEN
Bilirubin Urine: NEGATIVE
Glucose, UA: NEGATIVE mg/dL
Hgb urine dipstick: NEGATIVE
Ketones, ur: NEGATIVE mg/dL
Leukocytes, UA: NEGATIVE
Nitrite: NEGATIVE
Protein, ur: NEGATIVE mg/dL
Specific Gravity, Urine: 1.021 (ref 1.005–1.030)
pH: 5 (ref 5.0–8.0)

## 2015-07-12 MED ORDER — KETOROLAC TROMETHAMINE 60 MG/2ML IM SOLN
60.0000 mg | Freq: Once | INTRAMUSCULAR | Status: DC
  Filled 2015-07-12: qty 2

## 2015-07-12 MED ORDER — ORPHENADRINE CITRATE 30 MG/ML IJ SOLN
60.0000 mg | Freq: Two times a day (BID) | INTRAMUSCULAR | Status: DC
  Filled 2015-07-12: qty 2

## 2015-07-12 MED ORDER — METHOCARBAMOL 750 MG PO TABS: 1500.0000 mg | ORAL_TABLET | Freq: Four times a day (QID) | ORAL | Status: DC

## 2015-07-12 MED ORDER — NAPROXEN 500 MG PO TABS: 500.0000 mg | ORAL_TABLET | Freq: Two times a day (BID) | ORAL | Status: DC

## 2015-07-12 NOTE — ED Notes (Signed)
Reports lower back pain, hx of the same.  Ambulates well. 

## 2015-07-12 NOTE — ED Provider Notes (Signed)
[facility] Emergency Department [name] Note  ____________________________________________  Time seen: Approximately 12:16 PM  I have reviewed the triage vital signs and the nursing notes.   HISTORY  Chief Complaint Back Pain    HPI [name] is a 54 y.o. male patient complaining of left low back pain for 2 months. Patient came to the ER 3 days ago for left secondary to wait time. Patient also complaining of left flank pain with malodorous urine. Patient denies any dysuria or frequency. Patient is rating his back pain as a 10 over 10 but refused Toradol when offered while waiting for urine x-ray report. Patient denies any radicular component to this pain. Denies any palliative measures.   Past Medical History  Diagnosis Date  . Hyperlipidemia     There are no active problems to display for this patient.   Past Surgical History  Procedure Laterality Date  . 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 Knee surgery      bilateral  . Foot surgery    . Wrist surgery      Current Outpatient Rx  Name  Route  Sig  Dispense  Refill  . doxycycline (VIBRA-TABS) 100 MG tablet   Oral   Take 1 tablet (100 mg total) by mouth 2 (two) times daily.   28 tablet   0   . esomeprazole (NEXIUM) 40 MG capsule   Oral   Take 1 capsule (40 mg total) by mouth daily.   90 capsule   3   . ibuprofen (ADVIL,MOTRIN) 200 MG tablet   Oral   Take 400 mg by mouth every 6 (six) hours as needed for pain.           Allergies Codeine and Percocet  Family History  Problem Relation Age of Onset  . Asthma Mother   . Arthritis Mother   . Diabetes Maternal Aunt     Social History Social History  Substance Use Topics  . Smoking status: Never Smoker   . Smokeless tobacco: Never Used  . Alcohol Use: Yes     Comment: socially    Review of Systems Constitutional: No fever/chills Eyes: No visual changes. ENT: No sore  throat. Cardiovascular: Denies chest pain. Respiratory: Denies shortness of breath. Gastrointestinal: No abdominal pain.  No nausea, no vomiting.  No diarrhea.  No constipation. Genitourinary: Negative for dysuria. Malodorous urine Musculoskeletal: Positive for back pain. Skin: Negative for rash. Neurological: Negative for headaches, focal weakness or numbness. Endocrine:Hyperlipidemia Hematological/Lymphatic: Allergic/Immunilogical: See medication list  10-point ROS otherwise negative.  ____________________________________________   PHYSICAL EXAM:  VITAL SIGNS: ED Triage Vitals  Enc Vitals Group     BP --      Pulse --      Resp --      Temp --      Temp src --      SpO2 --      Weight --      Height --      Head Cir --      Peak Flow --      Pain Score 07/12/15 1139 10     Pain Loc --      Pain Edu? --      Excl. in GC? --     Constitutional: Alert and oriented. Well appearing and in no acute distress. Eyes: Conjunctivae are normal. PERRL. EOMI. Head: Atraumatic. Nose: No congestion/rhinnorhea. Mouth/Throat: Mucous membranes are moist.  Oropharynx non-erythematous.  Neck: No stridor.   Hematological/Lymphatic/Immunilogical: No cervical lymphadenopathy. Cardiovascular: Normal rate, regular rhythm. Grossly normal heart sounds.  Good peripheral circulation. Respiratory: Normal respiratory effort.  No retractions. Lungs CTAB. Gastrointestinal: Soft and nontender. No distention. No abdominal bruits. No CVA tenderness. Musculoskeletal: No lower extremity tenderness nor edema.  No joint effusions. Neurologic:  Normal speech and language. No gross focal neurologic deficits are appreciated. No gait instability. Skin:  Skin is warm, dry and intact. No rash noted. Psychiatric: Mood and affect are normal. Speech and behavior are normal.  ____________________________________________   LABS (all labs ordered are listed, but only abnormal results are displayed)  Labs  Reviewed  URINALYSIS COMPLETEWITH MICROSCOPIC ([facility] ONLY) - Abnormal; Notable for the following:    Color, Urine YELLOW (*)    APPearance CLEAR (*)    Squamous Epithelial / LPF 0-5 (*)    All other components within normal limits   ____________________________________________  EKG   ____________________________________________  RADIOLOGY  No acute findings on lumbar x-ray. I, Joni Reining, personally viewed and evaluated these images (plain radiographs) as part of my medical decision making.   ____________________________________________   PROCEDURES  Procedure(s) performed: None  Critical Care performed: No  ____________________________________________   INITIAL IMPRESSION / ASSESSMENT AND PLAN / ED COURSE  Pertinent labs & imaging results that were available during my care of the patient were reviewed by me and considered in my medical decision making (see chart for details).  Discussed x-ray and lab reports with patient. Patient diagnosed with lumbar strain. Patient given a prescription for Robaxin and naproxen. Patient given a work note for today. ____________________________________________   FINAL CLINICAL IMPRESSION(S) / ED DIAGNOSES  Final diagnoses:  Lumbar strain, initial encounter  Osteoarthritis of spine with radiculopathy, lumbar region      Joni Reining, PA-C 07/12/15 1353  Gayla Doss, MD 07/12/15 [phone]

## 2015-07-12 NOTE — ED Notes (Signed)
Pt reports pain is better now than it was at time of pt's arrival to ED. Pt refuses IM medications, states, "ill just take ibuprofen at home... It usually helps." PA made aware.

## 2015-07-12 NOTE — ED Notes (Signed)
States he is having lower back pain and was seen this weekend for same   Pain is moving now to left hip area

## 2015-07-13 ENCOUNTER — Emergency Department
Admission: EM | Admit: 2015-07-13 | Discharge: 2015-07-13 | Disposition: A | Discharge status: Home / Self Care | Payer: Medicaid - Out of State | Attending: Emergency Medicine | Admitting: Emergency Medicine

## 2015-07-13 DIAGNOSIS — Z791 Long term (current) use of non-steroidal anti-inflammatories (NSAID): Secondary | ICD-10-CM | POA: Diagnosis not present

## 2015-07-13 DIAGNOSIS — M79605 Pain in left leg: Secondary | ICD-10-CM | POA: Diagnosis present

## 2015-07-13 DIAGNOSIS — M545 Low back pain: Secondary | ICD-10-CM | POA: Diagnosis not present

## 2015-07-13 DIAGNOSIS — Z0289 Encounter for other administrative examinations: Secondary | ICD-10-CM | POA: Insufficient documentation

## 2015-07-13 DIAGNOSIS — Z79899 Other long term (current) drug therapy: Secondary | ICD-10-CM | POA: Insufficient documentation

## 2015-07-13 NOTE — ED Notes (Signed)
Pt reports was seen here yesterday, dx'd with pulled muscle and given meds and a work note. Pt reports he is unable to return to work today due to pain and needs a note for more days.

## 2015-07-13 NOTE — Discharge Instructions (Signed)
Back Pain, Adult Low back pain is very common. About 1 in 5 people have back pain.The cause of low back pain is rarely dangerous. The pain often gets better over time.About half of people with a sudden onset of back pain feel better in just 2 weeks. About 8 in 10 people feel better by 6 weeks.  CAUSES Some common causes of back pain include:  Strain of the muscles or ligaments supporting the spine.  Wear and tear (degeneration) of the spinal discs.  Arthritis.  Direct injury to the back. DIAGNOSIS Most of the time, the direct cause of low back pain is not known.However, back pain can be treated effectively even when the exact cause of the pain is unknown.Answering your caregiver's questions about your overall health and symptoms is one of the most accurate ways to make sure the cause of your pain is not dangerous. If your caregiver needs more information, he or she may order lab work or imaging tests (X-rays or MRIs).However, even if imaging tests show changes in your back, this usually does not require surgery. HOME CARE INSTRUCTIONS For many people, back pain returns.Since low back pain is rarely dangerous, it is often a condition that people can learn to manageon their own.   Remain active. It is stressful on the back to sit or stand in one place. Do not sit, drive, or stand in one place for more than 30 minutes at a time. Take short walks on level surfaces as soon as pain allows.Try to increase the length of time you walk each day.  Do not stay in bed.Resting more than 1 or 2 days can delay your recovery.  Do not avoid exercise or work.Your body is made to move.It is not dangerous to be active, even though your back may hurt.Your back will likely heal faster if you return to being active before your pain is gone.  Pay attention to your body when you bend and lift. Many people have less discomfortwhen lifting if they bend their knees, keep the load close to their bodies,and  avoid twisting. Often, the most comfortable positions are those that put less stress on your recovering back.  Find a comfortable position to sleep. Use a firm mattress and lie on your side with your knees slightly bent. If you lie on your back, put a pillow under your knees.  Only take over-the-counter or prescription medicines as directed by your caregiver. Over-the-counter medicines to reduce pain and inflammation are often the most helpful.Your caregiver may prescribe muscle relaxant drugs.These medicines help dull your pain so you can more quickly return to your normal activities and healthy exercise.  Put ice on the injured area.  Put ice in a plastic bag.  Place a towel between your skin and the bag.  Leave the ice on for 15-20 minutes, 03-04 times a day for the first 2 to 3 days. After that, ice and heat may be alternated to reduce pain and spasms.  Ask your caregiver about trying back exercises and gentle massage. This may be of some benefit.  Avoid feeling anxious or stressed.Stress increases muscle tension and can worsen back pain.It is important to recognize when you are anxious or stressed and learn ways to manage it.Exercise is a great option. SEEK MEDICAL CARE IF:  You have pain that is not relieved with rest or medicine.  You have pain that does not improve in 1 week.  You have new symptoms.  You are generally not feeling well. SEEK   IMMEDIATE MEDICAL CARE IF:   You have pain that radiates from your back into your legs.  You develop new bowel or bladder control problems.  You have unusual weakness or numbness in your arms or legs.  You develop nausea or vomiting.  You develop abdominal pain.  You feel faint. Document Released: 10/09/2005 Document Revised: 04/09/2012 Document Reviewed: 02/10/2014 Exit[employer] Patient Information 2015 [employer]. This information is not intended to replace advice given to you by your health care provider. Make sure you  discuss any questions you have with your health care provider.  

## 2015-07-13 NOTE — ED Provider Notes (Signed)
[facility] Emergency Department Provider Note  ____________________________________________  Time seen: On arrival  I have reviewed the triage vital signs and the nursing notes.   HISTORY  Chief Complaint Leg Pain    HPI [name] is a 54 y.o. male who presents with lower back pain and requests additional work note. Patient was seen yesterday with complaints of left lower back pain. He was prescribed a muscle relaxer and NSAIDs and given a work note as stated that he should not lift greater than 25 pounds. He reports today that he needs a work note that keeps him from doing any lifting. He denies new symptoms. No fevers no chills. No focal deficits.    Past Medical History  Diagnosis Date  . Hyperlipidemia     There are no active problems to display for this patient.   Past Surgical History  Procedure Laterality Date  . 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 Knee surgery      bilateral  . Foot surgery    . Wrist surgery      Current Outpatient Rx  Name  Route  Sig  Dispense  Refill  . doxycycline (VIBRA-TABS) 100 MG tablet   Oral   Take 1 tablet (100 mg total) by mouth 2 (two) times daily.   28 tablet   0   . esomeprazole (NEXIUM) 40 MG capsule   Oral   Take 1 capsule (40 mg total) by mouth daily.   90 capsule   3   . ibuprofen (ADVIL,MOTRIN) 200 MG tablet   Oral   Take 400 mg by mouth every 6 (six) hours as needed for pain.         . methocarbamol (ROBAXIN-750) 750 MG tablet   Oral   Take 2 tablets (1,500 mg total) by mouth 4 (four) times daily.   40 tablet   0   . naproxen (NAPROSYN) 500 MG tablet   Oral   Take 1 tablet (500 mg total) by mouth 2 (two) times daily with a meal.   20 tablet   00     Allergies Codeine and Percocet  Family History  Problem Relation Age of Onset  . Asthma Mother   . Arthritis Mother   . Diabetes Maternal Aunt     Social History Social History  Substance  Use Topics  . Smoking status: Never Smoker   . Smokeless tobacco: Never Used  . Alcohol Use: Yes     Comment: socially    Review of Systems  Constitutional: Negative for fever. Eyes: Negative for visual changes. ENT: Negative for sore throat   Genitourinary: Negative for dysuria. Musculoskeletal: Negative for back pain. Skin: Negative for rash. Neurological: Negative for headaches or focal weakness   ____________________________________________   PHYSICAL EXAM:  VITAL SIGNS: ED Triage Vitals  Enc Vitals Group     BP 07/13/15 1050 133/54 mmHg     Pulse Rate 07/13/15 1050 66     Resp 07/13/15 1050 18     Temp 07/13/15 1050 97.5 F (36.4 C)     Temp Source 07/13/15 1050 Oral     SpO2 07/13/15 1050 98 %     Weight 07/13/15 1050 170 lb (77.111 kg)     Height 07/13/15 1050 6' (1.829 m)     Head Cir --      Peak Flow --      Pain Score 07/13/15 1050 9  Pain Loc --      Pain Edu? --      Excl. in GC? --      Constitutional: Alert and oriented. Well appearing and in no distress. Eyes: Conjunctivae are normal.  ENT   Head: Normocephalic and atraumatic.   Mouth/Throat: Mucous membranes are moist. Cardiovascular: Normal rate, regular rhythm.  Respiratory: Normal respiratory effort without tachypnea nor retractions.  Gastrointestinal: Soft and non-tender in all quadrants. No distention. There is no CVA tenderness. Musculoskeletal: Nontender with normal range of motion in all extremities. Patient with tenderness to palpation in the left paraspinal area consistent with muscle spasm/strain Neurologic:  Normal speech and language. No gross focal neurologic deficits are appreciated. Normal strength in the lower extremity is Skin:  Skin is warm, dry and intact. No rash noted. Psychiatric: Mood and affect are normal. Patient exhibits appropriate insight and judgment.  ____________________________________________    LABS (pertinent positives/negatives)  Labs  Reviewed - No data to display  ____________________________________________     ____________________________________________    RADIOLOGY I have personally reviewed any xrays that were ordered on this patient: None  ____________________________________________   PROCEDURES  Procedure(s) performed: none   ____________________________________________   INITIAL IMPRESSION / ASSESSMENT AND PLAN / ED COURSE  Pertinent labs & imaging results that were available during my care of the patient were reviewed by me and considered in my medical decision making (see chart for details).  Work note provided. Patient with exam consistent with muscle strain/spasm. Continue meds as prescribed yesterday. Follow up with PCP or return to the ED if any worsening  ____________________________________________   FINAL CLINICAL IMPRESSION(S) / ED DIAGNOSES  Final diagnoses:  Left low back pain, with sciatica presence unspecified     Jene Every, MD 07/13/15 1753

## 2015-07-13 NOTE — ED Notes (Signed)
Was seen yesterday for lower back pain and now states pain is at left hip today

## 2015-07-16 ENCOUNTER — Encounter: Payer: Self-pay | Admitting: Emergency Medicine

## 2015-07-16 ENCOUNTER — Emergency Department
Admission: EM | Admit: 2015-07-16 | Discharge: 2015-07-16 | Disposition: A | Discharge status: Home / Self Care | Payer: No Typology Code available for payment source | Attending: Emergency Medicine | Admitting: Emergency Medicine

## 2015-07-16 DIAGNOSIS — Z79899 Other long term (current) drug therapy: Secondary | ICD-10-CM | POA: Diagnosis not present

## 2015-07-16 DIAGNOSIS — M545 Low back pain: Secondary | ICD-10-CM | POA: Insufficient documentation

## 2015-07-16 DIAGNOSIS — M5410 Radiculopathy, site unspecified: Secondary | ICD-10-CM | POA: Diagnosis not present

## 2015-07-16 DIAGNOSIS — R200 Anesthesia of skin: Secondary | ICD-10-CM | POA: Diagnosis present

## 2015-07-16 DIAGNOSIS — Z792 Long term (current) use of antibiotics: Secondary | ICD-10-CM | POA: Insufficient documentation

## 2015-07-16 DIAGNOSIS — Z7952 Long term (current) use of systemic steroids: Secondary | ICD-10-CM | POA: Insufficient documentation

## 2015-07-16 MED ORDER — PREDNISONE 10 MG (21) PO TBPK: ORAL_TABLET | ORAL | Status: DC

## 2015-07-16 NOTE — Discharge Instructions (Signed)

## 2015-07-16 NOTE — ED Notes (Signed)
Pt states he was seen in the ED Monday and Wednesday for lower back pain and given ultram and muscle relaxer and now he has numbness to left upper leg

## 2015-07-16 NOTE — ED Provider Notes (Signed)
[facility] Emergency Department Christphor Groft Note  ____________________________________________  Time seen: Approximately 4:20 PM  I have reviewed the triage vital signs and the nursing notes.   HISTORY  Chief Complaint leg numbness     HPI [name] is a 54 y.o. male presenting with left upper leg numbness. Patient was seen in ED 5 days ago and diagnosed with a lumbosacral strain. Imaging and UA were normal at that time. He was prescribed Naproxen and Robaxin. Patient states that after taking the medication, his back pain was somewhat improved, but he developed numbness of his left upper thigh. He denies loss of bowel/bladder function and tingling in the area. He denies erythema and swelling. There has been no further trauma to the area. He denies numbness elsewhere and states that there is no radiation of pain. Patient tried taking Ibuprofen instead of the Naproxen, which he states helped with the back pain, but not with the numbness. Patient denies fever, chills, dysuria, hematuria, diarrhea, constipation, and rash. Patient is able to walk on leg without difficulty.    Past Medical History  Diagnosis Date  . Hyperlipidemia     There are no active problems to display for this patient.   Past Surgical History  Procedure Laterality Date  . 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 Knee surgery      bilateral  . Foot surgery    . Wrist surgery      Current Outpatient Rx  Name  Route  Sig  Dispense  Refill  . doxycycline (VIBRA-TABS) 100 MG tablet   Oral   Take 1 tablet (100 mg total) by mouth 2 (two) times daily.   28 tablet   0   . esomeprazole (NEXIUM) 40 MG capsule   Oral   Take 1 capsule (40 mg total) by mouth daily.   90 capsule   3   . ibuprofen (ADVIL,MOTRIN) 200 MG tablet   Oral   Take 400 mg by mouth every 6 (six) hours as needed for pain.         . methocarbamol (ROBAXIN-750) 750 MG tablet   Oral   Take 2  tablets (1,500 mg total) by mouth 4 (four) times daily.   40 tablet   0   . naproxen (NAPROSYN) 500 MG tablet   Oral   Take 1 tablet (500 mg total) by mouth 2 (two) times daily with a meal.   20 tablet   00   . predniSONE (STERAPRED UNI-PAK 21 TAB) 10 MG (21) TBPK tablet      Take 6 tablets on day 1 Take 5 tablets on day 2 Take 4 tablets on day 3 Take 3 tablets on day 4 Take 2 tablets on day 5 Take 1 tablet on day 6   21 tablet   0     Allergies Codeine and Percocet  Family History  Problem Relation Age of Onset  . Asthma Mother   . Arthritis Mother   . Diabetes Maternal Aunt     Social History Social History  Substance Use Topics  . Smoking status: Never Smoker   . Smokeless tobacco: Never Used  . Alcohol Use: Yes     Comment: socially    Review of Systems Constitutional: No fever/chills Eyes: No visual changes. ENT: No sore throat. Cardiovascular: Denies chest pain. Respiratory: Denies shortness of breath. Gastrointestinal: No abdominal pain.  No nausea, no vomiting.  No diarrhea.  No constipation.  Genitourinary: Negative for dysuria. Musculoskeletal: Positive for back pain. Skin: Negative for rash, erythema, or swelling. Neurological: Negative for headaches and focal weakness. Positive for numbness of left upper thigh.  10-point ROS otherwise negative.  ____________________________________________   PHYSICAL EXAM:  VITAL SIGNS: ED Triage Vitals  Enc Vitals Group     BP 07/16/15 1500 137/51 mmHg     Pulse Rate 07/16/15 1500 69     Resp 07/16/15 1500 18     Temp 07/16/15 1500 98.1 F (36.7 C)     Temp Source 07/16/15 1500 Oral     SpO2 07/16/15 1500 98 %     Weight 07/16/15 1500 170 lb (77.111 kg)     Height 07/16/15 1500 6' (1.829 m)     Head Cir --      Peak Flow --      Pain Score 07/16/15 1500 1     Pain Loc --      Pain Edu? --      Excl. in GC? --     Constitutional: Alert and oriented. Well appearing and in no acute  distress. Eyes: Conjunctivae are normal. PERRL. EOMI. Head: Atraumatic. Nose: No congestion/rhinnorhea. Mouth/Throat: Mucous membranes are moist.  Oropharynx non-erythematous. Neck: No stridor.  Hematological/Lymphatic/Immunilogical: No cervical lymphadenopathy. Cardiovascular: Normal rate, regular rhythm. Grossly normal heart sounds.  Good peripheral circulation. Respiratory: Normal respiratory effort.  No retractions. Lungs CTAB. Gastrointestinal: Soft and nontender. No distention. No abdominal bruits. No CVA tenderness. Musculoskeletal: Paravertebral tenderness to left lower back. No erythema or swelling noted. Full ROM to bilateral lower extremities. 5/5 strength in bilateral lower extremities. Sensation intact bilaterally. No gait disturbance. No lower extremity tenderness nor edema.  No joint effusions. Neurologic:  Normal speech and language. No gross focal neurologic deficits are appreciated. No gait instability. Patient able to walk on toes and on heels without difficulty. Straight leg raise not positive. 2 point discrimination normal. Skin:  Skin is warm, dry and intact. No rash noted. Psychiatric: Mood and affect are normal. Speech and behavior are normal.  ____________________________________________   LABS (all labs ordered are listed, but only abnormal results are displayed)  Labs Reviewed - No data to display ____________________________________________  EKG  None ____________________________________________  RADIOLOGY  None ____________________________________________   PROCEDURES  Procedure(s) performed: None  Critical Care performed: No  ____________________________________________   INITIAL IMPRESSION / ASSESSMENT AND PLAN / ED COURSE  Pertinent labs & imaging results that were available during my care of the patient were reviewed by me and considered in my medical decision making (see chart for details).  Patient was advised to follow up with the  primary care Sivan Quast or orthopedist for symptoms that are not improving over the next 7 days. He was advised to return to the emergency department for symptoms that change or worsen if unable to schedule an appointment with the primary care Anija Brickner or specialist. ____________________________________________   FINAL CLINICAL IMPRESSION(S) / ED DIAGNOSES  Final diagnoses:  Radicular leg pain      Chinita Pester, FNP 07/16/15 1814  Myrna Blazer, MD 07/28/15 [phone]

## 2015-07-22 ENCOUNTER — Telehealth: Payer: Self-pay | Admitting: Family Medicine

## 2015-07-22 ENCOUNTER — Ambulatory Visit (INDEPENDENT_AMBULATORY_CARE_PROVIDER_SITE_OTHER): Payer: Self-pay | Admitting: Family Medicine

## 2015-07-22 VITALS — BP 130/68 | HR 78 | Wt 179.4 lb

## 2015-07-22 DIAGNOSIS — M461 Sacroiliitis, not elsewhere classified: Secondary | ICD-10-CM

## 2015-07-22 MED ORDER — TRAMADOL HCL 50 MG PO TABS: 50.0000 mg | ORAL_TABLET | Freq: Three times a day (TID) | ORAL | Status: DC | PRN

## 2015-07-22 NOTE — Patient Instructions (Signed)
Switch to ibuprofen 4 tablets 3 times per day. Take the pain medicine that I will call in as needed for the pain. Heat for 20 minutes 3 times per day to your back and then stretching after that. Keep your back and nice and straight. Lift with your legs notcher back

## 2015-07-22 NOTE — Progress Notes (Signed)
   Subjective:    Patient ID: [name], male    DOB: April 17, 1961, 54 y.o.   MRN: [id number]  HPI He is here for evaluation of a three-week history of left-sided low back pain. He initially injured himself at home when he was lifting something. The next day he bent over again and noted left-sided back pain. He has been using ibuprofen intermittently with minimal relief. He was seen in the emergency room on September 20 and treated with Naprosyn. X-rays at that time were apparently negative. He was seen again on the 25th and went to a different hospital. He was seen in [facility] and again told us was mainly muscle spasm. He does complain of some radiation into the left hip and groin area but no weakness numbness or [place].   Review of Systems     Objective:   Physical Exam Alert and complaining of left-sided back pain. Tender to palpation over the left SI joint. Provocative testing was equivocal. Negative straight leg raising. DTRs normal.       Assessment & Plan:  Sacroiliitis - Plan: traMADol (ULTRAM) 50 MG tablet  his symptoms are most consistent with sacroiliitis. I will have him do heat, stretching, ibuprofen as well as tramadol. He is to return here on Monday. Note given for work. I also instructed him on proper lifting technique.

## 2015-07-22 NOTE — Telephone Encounter (Signed)
Pt called asking about return to work restrictions, discussed with JCL & he needs appt on Monday and will discuss then. Appt made

## 2015-07-26 ENCOUNTER — Encounter: Payer: Self-pay | Admitting: Family Medicine

## 2015-07-26 ENCOUNTER — Telehealth: Payer: Self-pay | Admitting: Family Medicine

## 2015-07-26 ENCOUNTER — Ambulatory Visit (INDEPENDENT_AMBULATORY_CARE_PROVIDER_SITE_OTHER): Payer: Self-pay | Admitting: Family Medicine

## 2015-07-26 VITALS — BP 146/82 | HR 80 | Wt 177.0 lb

## 2015-07-26 DIAGNOSIS — M461 Sacroiliitis, not elsewhere classified: Secondary | ICD-10-CM

## 2015-07-26 NOTE — Progress Notes (Signed)
   Subjective:    Patient ID: [name], male    DOB: July 09, 1961, 54 y.o.   MRN: [id number]  HPI he is here for recheck. He states that he is feeling much better. He rates his pain as 3 out of 10 as compared to 8 out of 10 on his last visit.  Review of Systems     Objective:   Physical Exam Alert and in no distress. Slight tenderness palpation over the left SI joint.       Assessment & Plan:  Sacroiliitis (HCC)  recommend that he return to work tomorrow. I will give him a note with no lifting over 50 pounds for the next week. Also demonstrated proper lifting technique. He is to continue on his present medications.

## 2015-07-26 NOTE — Patient Instructions (Signed)
Take 12 ibuprofen per day

## 2015-07-26 NOTE — Telephone Encounter (Signed)
I have called Sonya at [facility] 5 times this morning.  I have left a message on the 2nd round.  Patient has called me back 5 times to find out if I have talked with [place].  I explained I am now just receiving an answering machine when I call.  So I requested pt to call Lamar Laundry and have her call me so I can let her know that I have faxed copy of out of work letter to her and pt was seen here in our office today.

## 2015-07-27 ENCOUNTER — Telehealth: Payer: Self-pay | Admitting: Family Medicine

## 2015-07-27 ENCOUNTER — Encounter: Payer: Self-pay | Admitting: Family Medicine

## 2015-07-27 NOTE — Telephone Encounter (Signed)
Write him a note that he can use ibuprofen during the day for pain relief. Tell him to not use tramadol and Valium while working or driving

## 2015-07-27 NOTE — Telephone Encounter (Signed)
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  Pt states Tramadol does make him a little woozy and is taking the Valium during the day.  He says he doesn't know if he should drive or not, wants your opinion.  And if it's ok, he needs a note faxed to t# [phone]

## 2015-07-27 NOTE — Telephone Encounter (Signed)
Note typed & faxed.  Called patient & informed of instructions per [facility]

## 2015-08-04 ENCOUNTER — Ambulatory Visit (INDEPENDENT_AMBULATORY_CARE_PROVIDER_SITE_OTHER): Payer: Worker's Compensation | Admitting: Medical

## 2015-08-04 ENCOUNTER — Encounter: Payer: Self-pay | Admitting: Medical

## 2015-08-04 ENCOUNTER — Ambulatory Visit: Payer: Self-pay | Admitting: Family Medicine

## 2015-08-04 ENCOUNTER — Telehealth: Payer: Self-pay | Admitting: Medical

## 2015-08-04 VITALS — BP 140/80 | HR 72 | Ht 71.0 in | Wt 176.0 lb

## 2015-08-04 DIAGNOSIS — S39012A Strain of muscle, fascia and tendon of lower back, initial encounter: Secondary | ICD-10-CM

## 2015-08-04 DIAGNOSIS — M62830 Muscle spasm of back: Secondary | ICD-10-CM | POA: Diagnosis not present

## 2015-08-04 MED ORDER — DIAZEPAM 5 MG PO TABS: 5.0000 mg | ORAL_TABLET | Freq: Two times a day (BID) | ORAL | Status: DC

## 2015-08-04 NOTE — Telephone Encounter (Signed)
Work note faxed to [phone]

## 2015-08-04 NOTE — Progress Notes (Signed)
Subjective: Chief Complaint  Patient presents with  . Back Pain    LOW BACK LEFT SIDE PATIENT HURT HIS BACK 08/03/15 ON HIS JOB REPLACING A FAUCET HE FELT SOMETHING PULL IN HIS BACK HE CONTINUED ON WITH WORK AND HE WENT HOME AND WAS WOKE UP FROM HIS SLEEP WITH THE PAIN IN HIS BACK   Here for work related injury.  He notes yesterday at work in the late afternoon (08/03/2015) he was changing out a faucet at work lying on his back under the sink cabinet in an apartment.   The faucet connections were locked up.  He twisted and turned with his arms on the faucet.  Felt a pull in his back while trying to remove the faucet.  The pain is located in left lower back above the hips.  He ended up calling coworker in, and it took both of them to change out the faucet.  Took 4 ibuprofen, this eased off the pain for a little while.  Awoke 2:30am crying in pain.  Back locked up and in pain.  Wife helped him up out of the bed as he couldn't get up on his own.   Used heating pad for a little over an hour.  Then tried some ice a little later.   Didn't ease the pain off much.   Took the last 2 ibuprofen he had this morning.  Denies any numbness, tingling, or weakness in arms or legs.  No other aggravating or relieving factors. No other complaint.   Past Medical History  Diagnosis Date  . Hyperlipidemia    ROS as in subjective   Objective: BP 140/80 mmHg  Pulse 72  Ht 5\' 11"  (1.803 m)  Wt 176 lb (79.833 kg)  BMI 24.56 kg/m2  SpO2 97%  Gen: wd, wn, nad Skin unremarkable, no erythema, no ecchymosis Tender in left lumbar paraspinal region, +spasm, seems stiff with limited ROM.  Pain with back extension which is limited the most.   Limited by pain in other ROM.  No obvious scoliosis.  -SLR MSK: legs nontender, no hip pain with ROM, no other deformity No extremity edema Pulses normal UE and LE    Assessment: Encounter Diagnoses  Name Primary?  . Back strain, initial encounter Yes  . Muscle spasm of back      Plan: Advised rest, heat, gentle stretching and medication below for the next 3-4 days.  Then gradual return to normal activity.  Discussed proper lifting, using pillows or pads to cushion when lying on back under cabinets with similar work.   Gave work restriction for the next week for no lifting over 50 lb.   If worse or not improving in the next 5-10 days, then recheck.

## 2015-08-06 ENCOUNTER — Telehealth: Payer: Self-pay | Admitting: Medical

## 2015-08-06 ENCOUNTER — Other Ambulatory Visit: Payer: Self-pay | Admitting: Medical

## 2015-08-06 DIAGNOSIS — M461 Sacroiliitis, not elsewhere classified: Secondary | ICD-10-CM

## 2015-08-06 MED ORDER — TRAMADOL HCL 50 MG PO TABS: ORAL_TABLET | ORAL | Status: DC

## 2015-08-06 NOTE — Telephone Encounter (Signed)
rx ready for pickup 

## 2015-08-06 NOTE — Telephone Encounter (Signed)
Tramadol called in to [facility] 

## 2015-08-06 NOTE — Telephone Encounter (Signed)
Discussed with Vincenza HewsShane & he advised to take pt to take #2 Tramadol at a time q8h.  Called pt and he was informed but he does need a refill, he is almost out of this medication.  [facility] [address]

## 2015-08-06 NOTE — Telephone Encounter (Signed)
Pt in a lot of pain, wants to be able to go back to work Monday but needs something to help with the pain.  Tramadol isn't helping.  Wants something stronger  Please advise

## 2015-08-09 ENCOUNTER — Encounter: Payer: Self-pay | Admitting: Medical

## 2015-08-09 ENCOUNTER — Ambulatory Visit (INDEPENDENT_AMBULATORY_CARE_PROVIDER_SITE_OTHER): Payer: Worker's Compensation | Admitting: Medical

## 2015-08-09 VITALS — BP 140/60 | HR 76 | Temp 97.6°F | Wt 176.0 lb

## 2015-08-09 DIAGNOSIS — M461 Sacroiliitis, not elsewhere classified: Secondary | ICD-10-CM | POA: Diagnosis not present

## 2015-08-09 DIAGNOSIS — M25552 Pain in left hip: Secondary | ICD-10-CM

## 2015-08-09 DIAGNOSIS — M533 Sacrococcygeal disorders, not elsewhere classified: Secondary | ICD-10-CM | POA: Diagnosis not present

## 2015-08-09 LAB — POCT URINALYSIS DIPSTICK
BILIRUBIN UA: NEGATIVE
Blood, UA: NEGATIVE
GLUCOSE UA: NEGATIVE
KETONES UA: NEGATIVE
LEUKOCYTES UA: NEGATIVE
Nitrite, UA: NEGATIVE
Protein, UA: NEGATIVE
SPEC GRAV UA: 1.015
Urobilinogen, UA: NEGATIVE
pH, UA: 7

## 2015-08-09 MED ORDER — IBUPROFEN 800 MG PO TABS: 800.0000 mg | ORAL_TABLET | Freq: Three times a day (TID) | ORAL | Status: DC | PRN

## 2015-08-09 MED ORDER — TRAMADOL HCL 50 MG PO TABS: ORAL_TABLET | ORAL | Status: DC

## 2015-08-09 NOTE — Progress Notes (Signed)
Subjective: Chief Complaint  Patient presents with  . back pain    numb in left hip area. said he tried to pick up a trash can and it was not heavy but it hurt him really bad.    Here for recheck on back pain, injury.  I saw him 08/04/15 for same after he "felt a pull in his back" at work while working under a sink in a cabinet to change out faucet.  He was initially seen back in September and October for sacroiliitis.  He notes that he did get improvement in the sacroiliitis prior to his visit with me on 08/04/15.  Over the weekend been using the Ultram.  He called in last week and we advised he could double up on Ultram since 1 tablet at a time wasn't helping.   Ultram makes him sleepy.  Worse pain is still left low back, but now having some pain in left anterior hip as well.  When extending the back feels like back is pulling apart.   When walking feels pain in left anterior hip.   No pain down the leg, mostly left low back and left upper thigh.   Was helping wife with laundry over the weekend, and simply lifting laundry basket brought him to tears.  Has used heating pad on and off this weekend.  Has felt dullness to sensation in left anterior upper thigh and left low back /buttock.   Can't take narcotics on his job, so he is not sure what to do.  Pain is limiting currently, and needs the medication to help with pain.   No fever, no saddle anesthesia, no change in bowel or bladder.   He is curious if he can c/t his current job ongoing with this type of pain.  No other aggravating or relieving factors. No other complaint.  Past Medical History  Diagnosis Date  . Hyperlipidemia    ROS as in subjective    Objective: BP 140/60 mmHg  Pulse 76  Temp(Src) 97.6 F (36.4 C)  Wt 176 lb (79.833 kg)  Gen: wd, wn, nad, in pain when standing and walking into the room Skin: anterior upper thigh with patch of red brown small 1-512mm diameter macules in a patch, birth mark per patient, no rash  thought Tender along left SI joint, tender along left anterior hip over anterior superior iliac spine.   otherwise non tender in back and buttocks and leg.    Pain with back flexion to 10 degrees, pain with limited ROM with extension.   No scoliosis Pain in back with left leg extension and flexion, but seated, leg ROM is not particularly tender.  No leg deformity.   Leg non tender.  Right leg exam unremarkable Ext: no edema Pulses normal Neuro: legs with seemingly normal strength, sensation.  DTRs seems 3+ UE and LE in general    Assessment: Encounter Diagnoses  Name Primary?  . Sacroiliac joint pain Yes  . Left hip pain      Plan: discussed his exam findings, symptoms, compared to last week's visit.   discussed case with his PCP and my supervising physician Dr. Susann GivensLalonde today.   We will have him c/t NSAIDS, Valium prn, ultram prn.  Advised he see orthopedist but he declines today.  Wants to just take 1 more week to resolve.   Note given out of work until 1 week from now.  F/u in 1wk.

## 2015-08-16 ENCOUNTER — Encounter: Payer: Self-pay | Admitting: Medical

## 2015-08-16 ENCOUNTER — Telehealth: Payer: Self-pay | Admitting: Medical

## 2015-08-16 ENCOUNTER — Ambulatory Visit (INDEPENDENT_AMBULATORY_CARE_PROVIDER_SITE_OTHER): Payer: Worker's Compensation | Admitting: Medical

## 2015-08-16 VITALS — BP 150/80 | HR 72 | Temp 98.0°F | Wt 184.0 lb

## 2015-08-16 DIAGNOSIS — M533 Sacrococcygeal disorders, not elsewhere classified: Secondary | ICD-10-CM

## 2015-08-16 DIAGNOSIS — R202 Paresthesia of skin: Secondary | ICD-10-CM | POA: Diagnosis not present

## 2015-08-16 DIAGNOSIS — M79605 Pain in left leg: Secondary | ICD-10-CM

## 2015-08-16 NOTE — Progress Notes (Signed)
Subjective: Chief Complaint  Patient presents with  . Back Pain    said it is better. still a little sore. been doing his exercising and taking meds. said hes been heating and icing. no longer having sharp pains. thinks hes ready to go back to work.   Here for 1 week recheck on back pain, injury.  I saw him 08/04/15 for same after he "felt a pull in his back" at work while working under a sink in a cabinet to change out faucet.  He was initially seen back in September and October for sacroiliitis.  He notes that he did get improvement in the sacroiliitis prior to his visit with me on 08/04/15. However, he ended up calling back in with pain, was seen again a week ago for same issues.  Since last visit much improved, not 100% but 80% better. Still gets occasional twinge of pain.  He no longer has the left hip pain or paresthesias.   He has resumed stretching and light exercise, feels improvement.  Using the medication with relief.   He would like to go back to work tomorrow but with some light duty, afraid of re injury.   No other aggravating or relieving factors. No other complaint.  ROS as in subjective   Objective: BP 150/80 mmHg  Pulse 72  Temp(Src) 98 F (36.7 C) (Oral)  Wt 184 lb (83.462 kg)  SpO2 98%  Gen: wd, wn, nad, seems to be doing well today Skin: anterior upper thigh with patch of red brown small 1-502mm diameter macules in a patch, birth mark per patient, no rash thought Only slight tenderness in left SI joint today, otherwise non tender in back and buttocks and leg.    Only mild pain with ROM today which seems relatively full.   No leg tenderness today, no pain in leg with hip or other leg ROM, No leg deformity.    Ext: no edema Pulses normal Neuro: legs with normal strength, sensation.  DTRs seems 3+ UE and LE in general    Assessment: Encounter Diagnoses  Name Primary?  . Sacroiliac joint pain Yes  . Left leg pain   . Paresthesia      Plan: discussed his exam  findings, symptoms, compared to last week's visit.  Will have him go back to work tomorrow but with restrictions of no lifting over 15lb, then after 1 week if no symptoms/flare up, can go back to work with full duty, no restrictions.   Discussed proper lifting, discussed avoidance of injury, discussed and demonstrated core strengthening exercises.   F/u prn.  Note give for work.

## 2015-08-16 NOTE — Telephone Encounter (Signed)
Faxed work note to t# [phone] [employer]

## 2015-08-24 ENCOUNTER — Emergency Department
Admission: EM | Admit: 2015-08-24 | Discharge: 2015-08-24 | Disposition: A | Discharge status: Home / Self Care | Payer: No Typology Code available for payment source | Attending: Emergency Medicine | Admitting: Emergency Medicine

## 2015-08-24 ENCOUNTER — Emergency Department: Payer: No Typology Code available for payment source

## 2015-08-24 ENCOUNTER — Encounter: Payer: Self-pay | Admitting: Emergency Medicine

## 2015-08-24 DIAGNOSIS — S39011A Strain of muscle, fascia and tendon of abdomen, initial encounter: Secondary | ICD-10-CM

## 2015-08-24 DIAGNOSIS — Y998 Other external cause status: Secondary | ICD-10-CM | POA: Insufficient documentation

## 2015-08-24 DIAGNOSIS — Y9389 Activity, other specified: Secondary | ICD-10-CM | POA: Insufficient documentation

## 2015-08-24 DIAGNOSIS — Z79899 Other long term (current) drug therapy: Secondary | ICD-10-CM | POA: Insufficient documentation

## 2015-08-24 DIAGNOSIS — Y9289 Other specified places as the place of occurrence of the external cause: Secondary | ICD-10-CM | POA: Insufficient documentation

## 2015-08-24 DIAGNOSIS — X58XXXA Exposure to other specified factors, initial encounter: Secondary | ICD-10-CM | POA: Insufficient documentation

## 2015-08-24 NOTE — ED Notes (Signed)
Pt c/o swelling and pain to groin area, states "it feels like a knot there"

## 2015-08-24 NOTE — ED Provider Notes (Signed)
[facility] Emergency Department Provider Note  ____________________________________________  Time seen: Approximately 7:51 AM  I have reviewed the triage vital signs and the nursing notes.   HISTORY  Chief Complaint Groin Pain    HPI [name] is a 54 y.o. male presents for evaluation of left lower abdomen and groin swelling times one day. Feels like a knot is down there. Describes it as a pulled muscle sensation. Rates the pain is 2/10 very minimal no change in symptoms with movement or working.   Past Medical History  Diagnosis Date  . Hyperlipidemia     There are no active problems to display for this patient.   Past Surgical History  Procedure Laterality Date  . 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 Knee surgery      bilateral  . Foot surgery    . Wrist surgery      Current Outpatient Rx  Name  Route  Sig  Dispense  Refill  . diazepam (VALIUM) 5 MG tablet   Oral   Take 1 tablet (5 mg total) by mouth 2 (two) times daily.   20 tablet   0   . esomeprazole (NEXIUM) 40 MG capsule   Oral   Take 1 capsule (40 mg total) by mouth daily. Patient not taking: Reported on 08/04/2015   90 capsule   3   . ibuprofen (ADVIL,MOTRIN) 200 MG tablet   Oral   Take 400 mg by mouth every 6 (six) hours as needed for pain.         Marland Kitchen ibuprofen (ADVIL,MOTRIN) 800 MG tablet   Oral   Take 1 tablet (800 mg total) by mouth every 8 (eight) hours as needed.   30 tablet   0   . traMADol (ULTRAM) 50 MG tablet      1-2 tablet po q8hr prn pain   30 tablet   0     Allergies Codeine and Percocet  Family History  Problem Relation Age of Onset  . Asthma Mother   . Arthritis Mother   . Diabetes Maternal Aunt     Social History Social History  Substance Use Topics  . Smoking status: Never Smoker   . Smokeless tobacco: Never Used  . Alcohol Use: Yes     Comment: socially    Review of Systems Constitutional: No  fever/chills Eyes: No visual changes. ENT: No sore throat. Cardiovascular: Denies chest pain. Respiratory: Denies shortness of breath. Gastrointestinal: No abdominal pain.  No nausea, no vomiting.  No diarrhea.  No constipation. Lower abdominal swelling left side Genitourinary: Negative for dysuria. Musculoskeletal: Negative for back pain. Skin: Negative for rash. Neurological: Negative for headaches, focal weakness or numbness.  10-point ROS otherwise negative.  ____________________________________________   PHYSICAL EXAM:  VITAL SIGNS: ED Triage Vitals  Enc Vitals Group     BP 08/24/15 0743 140/77 mmHg     Pulse Rate 08/24/15 0743 66     Resp 08/24/15 0743 18     Temp 08/24/15 0743 97.9 F (36.6 C)     Temp Source 08/24/15 0743 Oral     SpO2 08/24/15 0743 98 %     Weight 08/24/15 0743 177 lb (80.287 kg)     Height 08/24/15 0743 6' (1.829 m)     Head Cir --      Peak Flow --      Pain Score 08/24/15 0744 2     Pain Loc --  Pain Edu? --      Excl. in GC? --     Constitutional: Alert and oriented. Well appearing and in no acute distress. Eyes: Conjunctivae are normal. PERRL. EOMI. Head: Atraumatic. Nose: No congestion/rhinnorhea. Mouth/Throat: Mucous membranes are moist.  Oropharynx non-erythematous. Neck: No stridor.   Cardiovascular: Normal rate, regular rhythm. Grossly normal heart sounds.  Good peripheral circulation. Respiratory: Normal respiratory effort.  No retractions. Lungs CTAB. Gastrointestinal: Soft and nontender. No distention. No abdominal bruits. No CVA tenderness. Visually it appears that the left lower quadrant of his abdomen is Toni ArthursFuller and more round than the right lower quadrant. Musculoskeletal: No lower extremity tenderness nor edema.  No joint effusions. Neurologic:  Normal speech and language. No gross focal neurologic deficits are appreciated. No gait instability. Skin:  Skin is warm, dry and intact. No rash noted. Psychiatric: Mood and  affect are normal. Speech and behavior are normal.  ____________________________________________   LABS (all labs ordered are listed, but only abnormal results are displayed)  Labs Reviewed - No data to display ____________________________________________  RADIOLOGY  Normal bowel gas pattern.   ____________________________________________   PROCEDURES  Procedure(s) performed: None  Critical Care performed: No  ____________________________________________   INITIAL IMPRESSION / ASSESSMENT AND PLAN / ED COURSE  Pertinent labs & imaging results that were available during my care of the patient were reviewed by me and considered in my medical decision making (see chart for details).  Acute abdominal muscle strain. Rx encourage to take current medications that he has a home for muscle relaxers and pain.. Patient follow-up with PCP with any worsening or continuing symptomology. He voices no other complaints at this time ____________________________________________   FINAL CLINICAL IMPRESSION(S) / ED DIAGNOSES  Final diagnoses:  Strain of muscle, fascia and tendon of abdomen, initial encounter     Evangeline Dakinharles M Adger Cantera, PA-C 08/24/15 [id number]  Rockne MenghiniAnne-Caroline Norman, MD 08/24/15 1455

## 2015-08-24 NOTE — Discharge Instructions (Signed)
Muscle Strain °A muscle strain (pulled muscle) happens when a muscle is stretched beyond normal length. It happens when a sudden, violent force stretches your muscle too far. Usually, a few of the fibers in your muscle are torn. Muscle strain is common in athletes. Recovery usually takes 1-2 weeks. Complete healing takes 5-6 weeks.  °HOME CARE  °· Follow the PRICE method of treatment to help your injury get better. Do this the first 2-3 days after the injury: °¨ Protect. Protect the muscle to keep it from getting injured again. °¨ Rest. Limit your activity and rest the injured body part. °¨ Ice. Put ice in a plastic bag. Place a towel between your skin and the bag. Then, apply the ice and leave it on from 15-20 minutes each hour. After the third day, switch to moist heat packs. °¨ Compression. Use a splint or elastic bandage on the injured area for comfort. Do not put it on too tightly. °¨ Elevate. Keep the injured body part above the level of your heart. °· Only take medicine as told by your doctor. °· Warm up before doing exercise to prevent future muscle strains. °GET HELP IF:  °· You have more pain or puffiness (swelling) in the injured area. °· You feel numbness, tingling, or notice a loss of strength in the injured area. °MAKE SURE YOU:  °· Understand these instructions. °· Will watch your condition. °· Will get help right away if you are not doing well or get worse. °  °This information is not intended to replace advice given to you by your health care provider. Make sure you discuss any questions you have with your health care provider. °  °Document Released: 07/18/2008 Document Revised: 07/30/2013 Document Reviewed: 05/08/2013 °Elsevier Interactive Patient Education ©2016 Elsevier Inc. ° °

## 2015-08-24 NOTE — ED Notes (Signed)
Does not know for sure how long he has noticed some swelling in his left groin area

## 2015-08-27 ENCOUNTER — Ambulatory Visit: Payer: Self-pay | Admitting: Family Medicine

## 2015-09-06 ENCOUNTER — Ambulatory Visit: Payer: Self-pay | Admitting: Family Medicine

## 2015-09-08 ENCOUNTER — Ambulatory Visit (INDEPENDENT_AMBULATORY_CARE_PROVIDER_SITE_OTHER): Payer: Self-pay | Admitting: Family Medicine

## 2015-09-08 ENCOUNTER — Encounter: Payer: Self-pay | Admitting: Family Medicine

## 2015-09-08 VITALS — BP 130/80 | HR 73 | Ht 71.0 in | Wt 182.4 lb

## 2015-09-08 DIAGNOSIS — N433 Hydrocele, unspecified: Secondary | ICD-10-CM

## 2015-09-08 DIAGNOSIS — S39011A Strain of muscle, fascia and tendon of abdomen, initial encounter: Secondary | ICD-10-CM

## 2015-09-08 NOTE — Progress Notes (Signed)
   Subjective:    Patient ID: [name], male    DOB: 1960/12/22, 54 y.o.   MRN: [id number]  HPI He is here for evaluation of left lower quadrant pain. He was seen in the emergency room November 1 for this. The x-ray at that time showed nothing. He does complain of a fullness in that area. No nausea, vomiting, urinary symptoms. He also states that the left mid back pain that he had from several weeks ago is now much better. Review of Systems     Objective:   Physical Exam Left lower quadrant abdominal muscles are slightly more prominent on that since side than on the opposite side. He does have a hydrocele proximal to the testing on the left but no evidence of hernia.       Assessment & Plan:  Abdominal wall strain, initial encounter  I reassured him that he did not have any kidney or prostate related problems and he also has no evidence of a hernia. Since both of these are getting better on the room, no further intervention necessary.

## 2015-09-13 ENCOUNTER — Telehealth: Payer: Self-pay | Admitting: Family Medicine

## 2015-09-13 NOTE — Telephone Encounter (Signed)
Pt called and stated that he is still hurting. He states he is not any better. Pt states he was to call and let you know. Pt would like to speak to you. Pt can be reached at [phone]

## 2015-09-13 NOTE — Telephone Encounter (Signed)
He is still complaining of abdominal pain and states that it is no better. I recommended that he give it another week and if still having trouble, come in for reevaluation.

## 2015-09-14 ENCOUNTER — Telehealth: Payer: Self-pay | Admitting: Family Medicine

## 2015-09-14 NOTE — Telephone Encounter (Signed)
Pt informed and verbalized understanding

## 2015-09-14 NOTE — Telephone Encounter (Signed)
Pt called requesting a muscle relaxer  said he is still in pain in his lower back and groin area, says he is taking 3 ibuprofen in the morning and 3 at night and its not helping, says he is in a lot of pain, says he is pretty sure it is a pulled muscle,[facility] [place] pt can be reached at [phone].

## 2015-09-14 NOTE — Telephone Encounter (Signed)
He can take 4 ibuprofen 3 times per day which would be better than a muscle relaxer

## 2015-09-19 ENCOUNTER — Emergency Department
Admission: EM | Admit: 2015-09-19 | Discharge: 2015-09-19 | Disposition: A | Discharge status: Home / Self Care | Payer: Self-pay | Attending: Emergency Medicine | Admitting: Emergency Medicine

## 2015-09-19 ENCOUNTER — Emergency Department: Payer: No Typology Code available for payment source

## 2015-09-19 ENCOUNTER — Encounter: Payer: Self-pay | Admitting: Emergency Medicine

## 2015-09-19 DIAGNOSIS — F419 Anxiety disorder, unspecified: Secondary | ICD-10-CM | POA: Insufficient documentation

## 2015-09-19 DIAGNOSIS — M5442 Lumbago with sciatica, left side: Secondary | ICD-10-CM | POA: Insufficient documentation

## 2015-09-19 DIAGNOSIS — Z79899 Other long term (current) drug therapy: Secondary | ICD-10-CM | POA: Insufficient documentation

## 2015-09-19 DIAGNOSIS — K5900 Constipation, unspecified: Secondary | ICD-10-CM | POA: Insufficient documentation

## 2015-09-19 LAB — URINALYSIS COMPLETE WITH MICROSCOPIC (ARMC ONLY)
BACTERIA UA: NONE SEEN
Bilirubin Urine: NEGATIVE
GLUCOSE, UA: NEGATIVE mg/dL
HGB URINE DIPSTICK: NEGATIVE
Ketones, ur: NEGATIVE mg/dL
LEUKOCYTES UA: NEGATIVE
Nitrite: NEGATIVE
PH: 6 (ref 5.0–8.0)
Protein, ur: NEGATIVE mg/dL
SQUAMOUS EPITHELIAL / LPF: NONE SEEN
Specific Gravity, Urine: 1.012 (ref 1.005–1.030)

## 2015-09-19 LAB — COMPREHENSIVE METABOLIC PANEL
ALBUMIN: 4.6 g/dL (ref 3.5–5.0)
ALK PHOS: 72 U/L (ref 38–126)
ALT: 19 U/L (ref 17–63)
ANION GAP: 6 (ref 5–15)
AST: 21 U/L (ref 15–41)
BUN: 8 mg/dL (ref 6–20)
CALCIUM: 9.2 mg/dL (ref 8.9–10.3)
CO2: 27 mmol/L (ref 22–32)
Chloride: 104 mmol/L (ref 101–111)
Creatinine, Ser: 0.98 mg/dL (ref 0.61–1.24)
GFR calc Af Amer: >60 mL/min (ref >60)
GFR calc non Af Amer: >60 mL/min (ref >60)
GLUCOSE: 114 mg/dL — AB (ref 65–99)
Potassium: 3.5 mmol/L (ref 3.5–5.1)
SODIUM: 137 mmol/L (ref 135–145)
Total Bilirubin: 1 mg/dL (ref 0.3–1.2)
Total Protein: 7.3 g/dL (ref 6.5–8.1)

## 2015-09-19 LAB — CBC
HEMATOCRIT: 40.6 % (ref 40.0–52.0)
Hemoglobin: 13.7 g/dL (ref 13.0–18.0)
MCH: 31.1 pg (ref 26.0–34.0)
MCHC: 33.7 g/dL (ref 32.0–36.0)
MCV: 92.4 fL (ref 80.0–100.0)
Platelets: 217 10*3/uL (ref 150–440)
RBC: 4.39 MIL/uL — ABNORMAL LOW (ref 4.40–5.90)
RDW: 13.5 % (ref 11.5–14.5)
WBC: 7.2 10*3/uL (ref 3.8–10.6)

## 2015-09-19 MED ORDER — IOHEXOL 350 MG/ML SOLN
100.0000 mL | Freq: Once | INTRAVENOUS | Status: AC | PRN
  Administered 2015-09-19: 100 mL via INTRAVENOUS

## 2015-09-19 MED ORDER — IOHEXOL 240 MG/ML SOLN
25.0000 mL | Freq: Once | INTRAMUSCULAR | Status: AC | PRN
  Administered 2015-09-19: 25 mL via ORAL

## 2015-09-19 MED ORDER — NAPROXEN 500 MG PO TABS: 500.0000 mg | ORAL_TABLET | Freq: Two times a day (BID) | ORAL | Status: DC

## 2015-09-19 NOTE — Discharge Instructions (Signed)
Constipation, Adult Constipation is when a person has fewer than three bowel movements a week, has difficulty having a bowel movement, or has stools that are dry, hard, or larger than normal. As people grow older, constipation is more common. A low-fiber diet, not taking in enough fluids, and taking certain medicines may make constipation worse.  CAUSES   Certain medicines, such as antidepressants, pain medicine, iron supplements, antacids, and water pills.   Certain diseases, such as diabetes, irritable bowel syndrome (IBS), thyroid disease, or depression.   Not drinking enough water.   Not eating enough fiber-rich foods.   Stress or travel.   Lack of physical activity or exercise.   Ignoring the urge to have a bowel movement.   Using laxatives too much.  SIGNS AND SYMPTOMS   Having fewer than three bowel movements a week.   Straining to have a bowel movement.   Having stools that are hard, dry, or larger than normal.   Feeling full or bloated.   Pain in the lower abdomen.   Not feeling relief after having a bowel movement.  DIAGNOSIS  Your health care provider will take a medical history and perform a physical exam. Further testing may be done for severe constipation. Some tests may include:  A barium enema X-ray to examine your rectum, colon, and, sometimes, your small intestine.   A sigmoidoscopy to examine your lower colon.   A colonoscopy to examine your entire colon. TREATMENT  Treatment will depend on the severity of your constipation and what is causing it. Some dietary treatments include drinking more fluids and eating more fiber-rich foods. Lifestyle treatments may include regular exercise. If these diet and lifestyle recommendations do not help, your health care provider may recommend taking over-the-counter laxative medicines to help you have bowel movements. Prescription medicines may be prescribed if over-the-counter medicines do not work.    HOME CARE INSTRUCTIONS   Eat foods that have a lot of fiber, such as fruits, vegetables, whole grains, and beans.  Limit foods high in fat and processed sugars, such as french fries, hamburgers, cookies, candies, and soda.   A fiber supplement may be added to your diet if you cannot get enough fiber from foods.   Drink enough fluids to keep your urine clear or pale yellow.   Exercise regularly or as directed by your health care provider.   Go to the restroom when you have the urge to go. Do not hold it.   Only take over-the-counter or prescription medicines as directed by your health care provider. Do not take other medicines for constipation without talking to your health care provider first.  SEEK IMMEDIATE MEDICAL CARE IF:   You have bright red blood in your stool.   Your constipation lasts for more than 4 days or gets worse.   You have abdominal or rectal pain.   You have thin, pencil-like stools.   You have unexplained weight loss. MAKE SURE YOU:   Understand these instructions.  Will watch your condition.  Will get help right away if you are not doing well or get worse.   This information is not intended to replace advice given to you by your health care provider. Make sure you discuss any questions you have with your health care provider.   Document Released: 07/07/2004 Document Revised: 10/30/2014 Document Reviewed: 07/21/2013 Elsevier Interactive Patient Education 2016 Elsevier Inc.  Chronic Back Pain  When back pain lasts longer than 3 months, it is called chronic back pain.People with  chronic back pain often go through certain periods that are more intense (flare-ups).  CAUSES Chronic back pain can be caused by wear and tear (degeneration) on different structures in your back. These structures include:  The bones of your spine (vertebrae) and the joints surrounding your spinal cord and nerve roots (facets).  The strong, fibrous tissues that  connect your vertebrae (ligaments). Degeneration of these structures may result in pressure on your nerves. This can lead to constant pain. HOME CARE INSTRUCTIONS  Avoid bending, heavy lifting, prolonged sitting, and activities which make the problem worse.  Take brief periods of rest throughout the day to reduce your pain. Lying down or standing usually is better than sitting while you are resting.  Take over-the-counter or prescription medicines only as directed by your caregiver. SEEK IMMEDIATE MEDICAL CARE IF:   You have weakness or numbness in one of your legs or feet.  You have trouble controlling your bladder or bowels.  You have nausea, vomiting, abdominal pain, shortness of breath, or fainting.   This information is not intended to replace advice given to you by your health care provider. Make sure you discuss any questions you have with your health care provider.   Document Released: 11/16/2004 Document Revised: 01/01/2012 Document Reviewed: 03/29/2015 Elsevier Interactive Patient Education [employer] Elsevier Inc.

## 2015-09-19 NOTE — ED Notes (Signed)
AAOx3.  Skin warm and dry.  NAD  Ambulates with easy and steady gait.  Posture upright and relaxed. 

## 2015-09-19 NOTE — ED Notes (Signed)
Patient presents to the ED complaining of left flank pain that radiates into his groin x 8 weeks.  Patient states ibuprofen improves pain but he has been having to take a lot of ibuprofen lately.  Patient denies any difficulty urinating.  Patient states his doctor suggested he may have sciatica.  Patient states he feels that he is getting worse and not better and is concerned that on previous ED/doctor's visits something may have been missed.  Patient denies nausea, vomiting, and diarrhea.

## 2015-09-19 NOTE — ED Provider Notes (Signed)
[facility] Emergency Department Provider Note  ____________________________________________  Time seen: On arrival  I have reviewed the triage vital signs and the nursing notes.   HISTORY  Chief Complaint Flank Pain    HPI [name] is a 54 y.o. male who presents with complaints of left lower back pain for approximately 2 months for which she has been seen several times in the emergency department. He reports the pain radiates into his left groin occasionally. He denies serious or hematuria. No fevers chills. He is concerned that something more is going on. Denies nausea vomiting. No weight loss. No weakness or sensory deficits. No incontinence     Past Medical History  Diagnosis Date  . Hyperlipidemia     Patient Active Problem List   Diagnosis Date Noted  . Hydrocele, left 09/08/2015    Past Surgical History  Procedure Laterality Date  . 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 Knee surgery      bilateral  . Foot surgery    . Wrist surgery      Current Outpatient Rx  Name  Route  Sig  Dispense  Refill  . ibuprofen (ADVIL,MOTRIN) 200 MG tablet   Oral   Take 600-1,200 mg by mouth every 6 (six) hours as needed for mild pain or moderate pain (back pain.).          Marland Kitchen ranitidine (ZANTAC) 150 MG tablet   Oral   Take 150 mg by mouth daily.         . diazepam (VALIUM) 5 MG tablet   Oral   Take 1 tablet (5 mg total) by mouth 2 (two) times daily. Patient not taking: Reported on 09/08/2015   20 tablet   0   . esomeprazole (NEXIUM) 40 MG capsule   Oral   Take 1 capsule (40 mg total) by mouth daily. Patient not taking: Reported on 08/04/2015   90 capsule   3   . ibuprofen (ADVIL,MOTRIN) 800 MG tablet   Oral   Take 1 tablet (800 mg total) by mouth every 8 (eight) hours as needed.   30 tablet   0   . naproxen (NAPROSYN) 500 MG tablet   Oral   Take 1 tablet (500 mg total) by mouth 2 (two) times daily with  a meal.   20 tablet   2   . traMADol (ULTRAM) 50 MG tablet      1-2 tablet po q8hr prn pain Patient not taking: Reported on 09/08/2015   30 tablet   0     Allergies Codeine and Percocet  Family History  Problem Relation Age of Onset  . Asthma Mother   . Arthritis Mother   . Diabetes Maternal Aunt     Social History Social History  Substance Use Topics  . Smoking status: Never Smoker   . Smokeless tobacco: Never Used  . Alcohol Use: Yes     Comment: socially    Review of Systems  Constitutional: Negative for fever. Eyes: Negative for visual changes. ENT: Negative for sore throat Cardiovascular: Negative for chest pain. Respiratory: Negative for shortness of breath. Gastrointestinal: Negative for abdominal pain, vomiting and diarrhea. Genitourinary: Negative for dysuria. Musculoskeletal: Positive for back pain Skin: Negative for rash. Neurological: Negative for headaches or focal weakness Psychiatric: Positive for anxiety    ____________________________________________   PHYSICAL EXAM:  VITAL SIGNS: ED Triage Vitals  Enc Vitals Group     BP 09/19/15 0840  145/66 mmHg     Pulse Rate 09/19/15 0840 61     Resp 09/19/15 0840 20     Temp 09/19/15 0840 97.3 F (36.3 C)     Temp Source 09/19/15 0840 Oral     SpO2 09/19/15 0840 100 %     Weight --      Height --      Head Cir --      Peak Flow --      Pain Score 09/19/15 0848 8     Pain Loc --      Pain Edu? --      Excl. in GC? --      Constitutional: Alert and oriented. Well appearing and in no distress. Eyes: Conjunctivae are normal.  ENT   Head: Normocephalic and atraumatic.   Mouth/Throat: Mucous membranes are moist. Cardiovascular: Normal rate, regular rhythm. Normal and symmetric distal pulses are present in all extremities. No murmurs, rubs, or gallops. Respiratory: Normal respiratory effort without tachypnea nor retractions. Breath sounds are clear and equal bilaterally.   Gastrointestinal: Soft and non-tender in all quadrants. No distention. There is no CVA tenderness. Genitourinary: deferred Musculoskeletal: Nontender with normal range of motion in all extremities. No lower extremity tenderness nor edema. Mild tenderness to palpation in the left lower paraspinal area Neurologic:  Normal speech and language. No gross focal neurologic deficits are appreciated. Skin:  Skin is warm, dry and intact. No rash noted. Psychiatric: Mood and affect are normal. Patient exhibits appropriate insight and judgment.  ____________________________________________    LABS (pertinent positives/negatives)  Labs Reviewed  COMPREHENSIVE METABOLIC PANEL - Abnormal; Notable for the following:    Glucose, Bld 114 (*)    All other components within normal limits  CBC - Abnormal; Notable for the following:    RBC 4.39 (*)    All other components within normal limits  URINALYSIS COMPLETEWITH MICROSCOPIC ([facility] ONLY) - Abnormal; Notable for the following:    Color, Urine YELLOW (*)    APPearance CLEAR (*)    All other components within normal limits    ____________________________________________   EKG  None  ____________________________________________    RADIOLOGY I have personally reviewed any xrays that were ordered on this patient: CT head and pelvis shows constipation but no other abnormalities  ____________________________________________   PROCEDURES  Procedure(s) performed: none  Critical Care performed:none  ____________________________________________   INITIAL IMPRESSION / ASSESSMENT AND PLAN / ED COURSE  Pertinent labs & imaging results that were available during my care of the patient were reviewed by me and considered in my medical decision making (see chart for details).  Patient reassured after CT scan, we will treat this with anti-inflammatories and have him follow-up with orthopedics for presumed paraspinal lumbar muscular skeletal  pain  ____________________________________________   FINAL CLINICAL IMPRESSION(S) / ED DIAGNOSES  Final diagnoses:  Left-sided low back pain with left-sided sciatica  Constipation, unspecified constipation type     Jene Everyobert Shiquita Collignon, MD 09/19/15 1526

## 2015-09-24 ENCOUNTER — Emergency Department
Admission: EM | Admit: 2015-09-24 | Discharge: 2015-09-24 | Disposition: A | Discharge status: Home / Self Care | Payer: No Typology Code available for payment source | Attending: Emergency Medicine | Admitting: Emergency Medicine

## 2015-09-24 ENCOUNTER — Encounter: Payer: Self-pay | Admitting: Medical Oncology

## 2015-09-24 ENCOUNTER — Other Ambulatory Visit: Payer: Self-pay

## 2015-09-24 DIAGNOSIS — R112 Nausea with vomiting, unspecified: Secondary | ICD-10-CM | POA: Insufficient documentation

## 2015-09-24 DIAGNOSIS — Z79899 Other long term (current) drug therapy: Secondary | ICD-10-CM | POA: Diagnosis not present

## 2015-09-24 DIAGNOSIS — R509 Fever, unspecified: Secondary | ICD-10-CM | POA: Insufficient documentation

## 2015-09-24 DIAGNOSIS — R1013 Epigastric pain: Secondary | ICD-10-CM | POA: Insufficient documentation

## 2015-09-24 DIAGNOSIS — R197 Diarrhea, unspecified: Secondary | ICD-10-CM | POA: Diagnosis not present

## 2015-09-24 LAB — URINALYSIS COMPLETE WITH MICROSCOPIC (ARMC ONLY)
BILIRUBIN URINE: NEGATIVE
Bacteria, UA: NONE SEEN
GLUCOSE, UA: NEGATIVE mg/dL
Hgb urine dipstick: NEGATIVE
KETONES UR: NEGATIVE mg/dL
Leukocytes, UA: NEGATIVE
Nitrite: NEGATIVE
Protein, ur: NEGATIVE mg/dL
SPECIFIC GRAVITY, URINE: 1.014 (ref 1.005–1.030)
SQUAMOUS EPITHELIAL / LPF: NONE SEEN
pH: 6 (ref 5.0–8.0)

## 2015-09-24 LAB — COMPREHENSIVE METABOLIC PANEL
ALK PHOS: 83 U/L (ref 38–126)
ALT: 15 U/L — AB (ref 17–63)
AST: 19 U/L (ref 15–41)
Albumin: 4.6 g/dL (ref 3.5–5.0)
Anion gap: 6 (ref 5–15)
BUN: 10 mg/dL (ref 6–20)
CALCIUM: 9.4 mg/dL (ref 8.9–10.3)
CO2: 28 mmol/L (ref 22–32)
CREATININE: 1.05 mg/dL (ref 0.61–1.24)
Chloride: 102 mmol/L (ref 101–111)
GFR calc non Af Amer: >60 mL/min (ref >60)
GLUCOSE: 83 mg/dL (ref 65–99)
Potassium: 3.8 mmol/L (ref 3.5–5.1)
SODIUM: 136 mmol/L (ref 135–145)
Total Bilirubin: 0.9 mg/dL (ref 0.3–1.2)
Total Protein: 7.5 g/dL (ref 6.5–8.1)

## 2015-09-24 LAB — CBC
HCT: 41 % (ref 40.0–52.0)
Hemoglobin: 14.3 g/dL (ref 13.0–18.0)
MCH: 31.9 pg (ref 26.0–34.0)
MCHC: 34.8 g/dL (ref 32.0–36.0)
MCV: 91.7 fL (ref 80.0–100.0)
PLATELETS: 207 10*3/uL (ref 150–440)
RBC: 4.47 MIL/uL (ref 4.40–5.90)
RDW: 13.7 % (ref 11.5–14.5)
WBC: 11.5 10*3/uL — ABNORMAL HIGH (ref 3.8–10.6)

## 2015-09-24 LAB — LIPASE, BLOOD: Lipase: 32 U/L (ref 11–51)

## 2015-09-24 MED ORDER — ONDANSETRON HCL 4 MG/2ML IJ SOLN
INTRAMUSCULAR | Status: AC
  Filled 2015-09-24: qty 2

## 2015-09-24 MED ORDER — SODIUM CHLORIDE 0.9 % IV BOLUS (SEPSIS)
1000.0000 mL | Freq: Once | INTRAVENOUS | Status: AC
  Administered 2015-09-24: 1000 mL via INTRAVENOUS

## 2015-09-24 MED ORDER — DIPHENOXYLATE-ATROPINE 2.5-0.025 MG PO TABS
1.0000 | ORAL_TABLET | Freq: Once | ORAL | Status: AC
  Administered 2015-09-24: 1 via ORAL
  Filled 2015-09-24: qty 1

## 2015-09-24 MED ORDER — ONDANSETRON HCL 4 MG/2ML IJ SOLN: 4.0000 mg | Freq: Once | INTRAMUSCULAR | Status: DC

## 2015-09-24 MED ORDER — ONDANSETRON 4 MG PO TBDP: 4.0000 mg | ORAL_TABLET | Freq: Three times a day (TID) | ORAL | Status: DC | PRN

## 2015-09-24 MED ORDER — LOPERAMIDE HCL 2 MG PO TABS: 2.0000 mg | ORAL_TABLET | Freq: Four times a day (QID) | ORAL | Status: DC | PRN

## 2015-09-24 NOTE — ED Provider Notes (Signed)
[facility] Emergency Department Provider Note  ____________________________________________  Time seen: Approximately 7:18 PM  I have reviewed the triage vital signs and the nursing notes.   HISTORY  Chief Complaint Emesis and Diarrhea    HPI [name] is a 54 y.o. male presenting with 2 days of nausea vomiting and diarrhea. Patient reports that 2 nights ago he was at work when he developed multiple episodes of nonbloody vomiting. Yesterday he began to also have nonbloody diarrhea. He noticed that after a day of vomiting he developed some epigastric pain. He states that he had a fever to 100.0 yesterday. No new or unusual foods, known sick contacts, cough or cold symptoms, or myalgias. The patient is most concerned that he has missed work today and that his boss is requesting a note from a physician.   Past Medical History  Diagnosis Date  . Hyperlipidemia     Patient Active Problem List   Diagnosis Date Noted  . Hydrocele, left 09/08/2015    Past Surgical History  Procedure Laterality Date  . 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 Knee surgery      bilateral  . Foot surgery    . Wrist surgery      Current Outpatient Rx  Name  Route  Sig  Dispense  Refill  . diazepam (VALIUM) 5 MG tablet   Oral   Take 1 tablet (5 mg total) by mouth 2 (two) times daily. Patient not taking: Reported on 09/08/2015   20 tablet   0   . esomeprazole (NEXIUM) 40 MG capsule   Oral   Take 1 capsule (40 mg total) by mouth daily. Patient not taking: Reported on 08/04/2015   90 capsule   3   . loperamide (IMODIUM A-D) 2 MG tablet   Oral   Take 1 tablet (2 mg total) by mouth 4 (four) times daily as needed for diarrhea or loose stools.   15 tablet   0   . ondansetron (ZOFRAN ODT) 4 MG disintegrating tablet   Oral   Take 1 tablet (4 mg total) by mouth every 8 (eight) hours as needed for nausea or vomiting.   12 tablet   0   .  ranitidine (ZANTAC) 150 MG tablet   Oral   Take 150 mg by mouth daily.         . traMADol (ULTRAM) 50 MG tablet      1-2 tablet po q8hr prn pain Patient not taking: Reported on 09/08/2015   30 tablet   0     Allergies Codeine and Percocet  Family History  Problem Relation Age of Onset  . Asthma Mother   . Arthritis Mother   . Diabetes Maternal Aunt     Social History Social History  Substance Use Topics  . Smoking status: Never Smoker   . Smokeless tobacco: Never Used  . Alcohol Use: Yes     Comment: socially    Review of Systems Constitutional: Positive fever no chills. No syncope or lightheadedness. Eyes: No visual changes. ENT: No sore throat. Cardiovascular: Denies chest pain, palpitations. Respiratory: Denies shortness of breath.  No cough. Gastrointestinal: Positive epigastric abdominal pain.  Positive nausea, positive vomiting.  No diarrhea.  No constipation. Genitourinary: Negative for dysuria. Musculoskeletal: Negative for back pain. Skin: Negative for rash. Neurological: Negative for headaches, focal weakness or numbness.  10-point ROS otherwise negative.  ____________________________________________   PHYSICAL EXAM:  VITAL SIGNS: ED  Triage Vitals  Enc Vitals Group     BP 09/24/15 1847 142/75 mmHg     Pulse Rate 09/24/15 1847 79     Resp 09/24/15 1847 18     Temp 09/24/15 1847 97.6 F (36.4 C)     Temp Source 09/24/15 1847 Oral     SpO2 09/24/15 1847 98 %     Weight 09/24/15 1847 177 lb (80.287 kg)     Height 09/24/15 1847 6' (1.829 m)     Head Cir --      Peak Flow --      Pain Score 09/24/15 1848 4     Pain Loc --      Pain Edu? --      Excl. in GC? --     Constitutional: Alert and oriented. Well appearing and in no acute distress. Answer question appropriately. Eyes: Conjunctivae are normal.  EOMI. Head: Atraumatic. Nose: No congestion/rhinnorhea. Mouth/Throat: Mucous membranes are moist.  Neck: No stridor.  Supple.  No  JVD. Cardiovascular: Normal rate, regular rhythm. No murmurs, rubs or gallops.  Respiratory: Normal respiratory effort.  No retractions. Lungs CTAB.  No wheezes, rales or ronchi. Gastrointestinal: Abdomen is soft and nondistended. The patient has diffuse tenderness to palpation that is nonfocal throughout the entirety of the abdomen. No peritoneal signs, no guarding or rebound. Musculoskeletal: No LE edema.  Neurologic:  Normal speech and language. No gross focal neurologic deficits are appreciated.  Skin:  Skin is warm, dry and intact. No rash noted. Psychiatric: Mood and affect are normal. Speech and behavior are normal.  Normal judgement.  ____________________________________________   LABS (all labs ordered are listed, but only abnormal results are displayed)  Labs Reviewed  COMPREHENSIVE METABOLIC PANEL - Abnormal; Notable for the following:    ALT 15 (*)    All other components within normal limits  CBC - Abnormal; Notable for the following:    WBC 11.5 (*)    All other components within normal limits  URINALYSIS COMPLETEWITH MICROSCOPIC ([facility] ONLY) - Abnormal; Notable for the following:    Color, Urine YELLOW (*)    APPearance CLEAR (*)    All other components within normal limits  LIPASE, BLOOD   ____________________________________________  EKG  ED ECG REPORT I, Rockne Menghini, the attending physician, personally viewed and interpreted this ECG.   Date: 09/24/2015  EKG Time: 1934  Rate: 67  Rhythm: normal sinus rhythm  Axis: Normal  Intervals:none  ST&T Change: Nonspecific T-wave inversions in V1. No ST elevation.  ____________________________________________  RADIOLOGY  No results found.  ____________________________________________   PROCEDURES  Procedure(s) performed: None  Critical Care performed: No ____________________________________________   INITIAL IMPRESSION / ASSESSMENT AND PLAN / ED COURSE  Pertinent labs & imaging results  that were available during my care of the patient were reviewed by me and considered in my medical decision making (see chart for details).  54 y.o. male presenting with 2-3 days of nausea vomiting and diarrhea with abdominal pain that started after his initial symptoms. On my exam, the patient is afebrile with stable vital signs. He does not have signs or symptoms of significant dehydration. He does not have focality in his abdominal exam which would be concerning for acute surgical pathology. I think the most that the etiology of his symptoms is a viral GI illness or perhaps a foodborne illness. Also consider flulike illness. I will do basic labs as well as a urinalysis, and initiate symptomatic treatment.  ----------------------------------------- 7:41 PM on 09/24/2015 -----------------------------------------  The patient states that he is feeling better after fluid. He deferred the Zofran stating he had taken a nausea suppository at home and was already feeling better. He is able to tolerate by mouth. His labs are reassuring with a minimally elevated white blood cell count of 11.5 but I have reexamined his abdomen and he continues to have a nonfocal exam which is actually improved compared to his arrival. I have given him return precautions and will plan to discharge him home with anti-emetics, antidiarrheals and diet information.   ____________________________________________  FINAL CLINICAL IMPRESSION(S) / ED DIAGNOSES  Final diagnoses:  Nausea vomiting and diarrhea      NEW MEDICATIONS STARTED DURING THIS VISIT:  Discharge Medication List as of 09/24/2015  7:45 PM    START taking these medications   Details  loperamide (IMODIUM A-D) 2 MG tablet Take 1 tablet (2 mg total) by mouth 4 (four) times daily as needed for diarrhea or loose stools., Starting 09/24/2015, Until Discontinued, Print    ondansetron (ZOFRAN ODT) 4 MG disintegrating tablet Take 1 tablet (4 mg total) by mouth  every 8 (eight) hours as needed for nausea or vomiting., Starting 09/24/2015, Until Discontinued, Print         Rockne MenghiniAnne-Caroline Minnetta Sandora, MD 09/24/15 2231

## 2015-09-24 NOTE — ED Notes (Signed)
Pt reports that he began 3 days ago with body aches, nausea, vomiting, diarrhea. Reports generalized abd pain.

## 2015-09-24 NOTE — Discharge Instructions (Signed)
Please drink plenty of fluid to stay well-hydrated. You may use Zofran for nausea and vomiting. Please take a clear liquid diet for the next 12-24 hours, then advance to a BRAT diet.  Please return to the emergency department if you develop severe pain, pain that is only in one area of your abdomen, inability to keep down fluids, fever, or any other symptoms concerning to you.

## 2015-10-19 ENCOUNTER — Ambulatory Visit (INDEPENDENT_AMBULATORY_CARE_PROVIDER_SITE_OTHER): Payer: Self-pay | Admitting: Family Medicine

## 2015-10-19 ENCOUNTER — Encounter: Payer: Self-pay | Admitting: Family Medicine

## 2015-10-19 ENCOUNTER — Telehealth: Payer: Self-pay | Admitting: Family Medicine

## 2015-10-19 ENCOUNTER — Telehealth: Payer: Self-pay

## 2015-10-19 VITALS — BP 126/70 | HR 76 | Resp 14

## 2015-10-19 DIAGNOSIS — S39011D Strain of muscle, fascia and tendon of abdomen, subsequent encounter: Secondary | ICD-10-CM

## 2015-10-19 DIAGNOSIS — R1032 Left lower quadrant pain: Secondary | ICD-10-CM

## 2015-10-19 NOTE — Telephone Encounter (Signed)
Send a copy of my note as soon as I finish it

## 2015-10-19 NOTE — Telephone Encounter (Signed)
Let him know that the CT scan really didn't show anything. Have him schedule an appointment to see me.

## 2015-10-19 NOTE — Telephone Encounter (Signed)
Pt called and stated that he is still having issues with the knot in his stomach/groin area. He stated he called you last night and you told him to call in today and for us to send a message back so you can review his situation. Pt can be reach at work today at [phone] or on cell phone at [phone].

## 2015-10-19 NOTE — Telephone Encounter (Signed)
Pt is scheduled with [facility] on 11/04/15 with Dr. Luisa Hartornett @1 :00

## 2015-10-19 NOTE — Patient Instructions (Signed)
Abdominal eventration

## 2015-10-19 NOTE — Telephone Encounter (Signed)
Pt scheduled for Thursday.

## 2015-10-19 NOTE — Progress Notes (Signed)
   Subjective:    Patient ID: [name], male    DOB: [birth year], 54 y.o.   MRN: [id number]  HPI He is here again for evaluation of left lower quadrant discomfort. Apparently the pain bothers him on a daily basis requiring him to take OTC medications. As been seen in the past and indeed recently was seen in the CT scan ordered of his abdomen. It was essentially negative.   Review of Systems     Objective:   Physical Exam Alert and in no distress. Fullness is noted in the left lower quadrant superior to the inguinal ligament. No hernias noted. When he lies down and the fullness does go away.       Assessment & Plan:  Abdominal wall strain, subsequent encounter - Plan: Ambulatory referral to General Surgery  Abdominal pain, left lower quadrant - Plan: Ambulatory referral to General Surgery  this seems to be more of an abdominal wall eventration/laxity of the fascia. Since he is having some discomfort with this I will have him see general surgery to get their input into this.

## 2015-10-20 NOTE — Telephone Encounter (Signed)
All info already sent

## 2015-10-21 ENCOUNTER — Ambulatory Visit: Payer: Self-pay | Admitting: Family Medicine

## 2015-10-28 ENCOUNTER — Telehealth: Payer: Self-pay

## 2015-10-28 NOTE — Telephone Encounter (Signed)
Medical Records sent to [facility] on 10/28/15

## 2015-10-29 ENCOUNTER — Telehealth: Payer: Self-pay | Admitting: Family Medicine

## 2015-10-29 NOTE — Telephone Encounter (Signed)
This needs to be evaluated. Have him set up an appointment

## 2015-10-29 NOTE — Telephone Encounter (Signed)
Pt was advised that he would need an appt to get evaluated but he said he would have to call us back

## 2015-10-29 NOTE — Telephone Encounter (Signed)
Pt states urine smells bad & burns when he urinates & asked if you would send him in an antibiotic

## 2015-11-04 ENCOUNTER — Ambulatory Visit (INDEPENDENT_AMBULATORY_CARE_PROVIDER_SITE_OTHER): Payer: PRIVATE HEALTH INSURANCE | Admitting: Family Medicine

## 2015-11-04 ENCOUNTER — Encounter: Payer: Self-pay | Admitting: Family Medicine

## 2015-11-04 VITALS — BP 150/72 | HR 86 | Wt 187.6 lb

## 2015-11-04 DIAGNOSIS — N433 Hydrocele, unspecified: Secondary | ICD-10-CM

## 2015-11-04 DIAGNOSIS — N410 Acute prostatitis: Secondary | ICD-10-CM

## 2015-11-04 MED ORDER — DOXYCYCLINE HYCLATE 100 MG PO TABS: 100.0000 mg | ORAL_TABLET | Freq: Two times a day (BID) | ORAL | Status: DC

## 2015-11-04 NOTE — Progress Notes (Signed)
   Subjective:    Patient ID: [name], male    DOB: Jun 08, 1961, 55 y.o.   MRN: [id number]  HPI Complains of a two-week history of dysuria and some discomfort in the left lower quadrant when he has a full bladder. No discharge, decreased stream, hesitancy or incomplete emptying. His last sexual activity as was one month ago with his wife.   Review of Systems     Objective:   Physical Exam Genital exam is normal, left hydrocele still noted. Rectal exam does show a tender boggy prostate.       Assessment & Plan:  Acute prostatitis - Plan: doxycycline (VIBRA-TABS) 100 MG tablet I reassured him that this was easy to take care. I also reassured him that the present symptoms are unrelated to the discomfort he was having in the left lower quadrant.

## 2016-01-05 ENCOUNTER — Encounter: Payer: Self-pay | Admitting: Medical

## 2016-01-17 ENCOUNTER — Ambulatory Visit (INDEPENDENT_AMBULATORY_CARE_PROVIDER_SITE_OTHER): Payer: PRIVATE HEALTH INSURANCE | Admitting: Medical

## 2016-01-17 ENCOUNTER — Encounter: Payer: Self-pay | Admitting: Medical

## 2016-01-17 VITALS — BP 118/70 | HR 69 | Ht 70.25 in | Wt 184.0 lb

## 2016-01-17 DIAGNOSIS — L723 Sebaceous cyst: Secondary | ICD-10-CM | POA: Diagnosis not present

## 2016-01-17 DIAGNOSIS — R1030 Lower abdominal pain, unspecified: Secondary | ICD-10-CM | POA: Diagnosis not present

## 2016-01-17 DIAGNOSIS — K219 Gastro-esophageal reflux disease without esophagitis: Secondary | ICD-10-CM | POA: Diagnosis not present

## 2016-01-17 DIAGNOSIS — Z125 Encounter for screening for malignant neoplasm of prostate: Secondary | ICD-10-CM | POA: Insufficient documentation

## 2016-01-17 DIAGNOSIS — Z0000 Encounter for general adult medical examination without abnormal findings: Secondary | ICD-10-CM | POA: Diagnosis not present

## 2016-01-17 DIAGNOSIS — L989 Disorder of the skin and subcutaneous tissue, unspecified: Secondary | ICD-10-CM

## 2016-01-17 DIAGNOSIS — N62 Hypertrophy of breast: Secondary | ICD-10-CM | POA: Diagnosis not present

## 2016-01-17 DIAGNOSIS — Z1211 Encounter for screening for malignant neoplasm of colon: Secondary | ICD-10-CM

## 2016-01-17 DIAGNOSIS — K760 Fatty (change of) liver, not elsewhere classified: Secondary | ICD-10-CM | POA: Insufficient documentation

## 2016-01-17 LAB — HEMOGLOBIN A1C
HEMOGLOBIN A1C: 5.1 % (ref <5.7)
Mean Plasma Glucose: 100 mg/dL

## 2016-01-17 LAB — LIPID PANEL
Cholesterol: 186 mg/dL (ref 125–200)
HDL: 56 mg/dL (ref >=40)
LDL CALC: 121 mg/dL (ref <130)
TRIGLYCERIDES: 45 mg/dL (ref <150)
Total CHOL/HDL Ratio: 3.3 Ratio (ref <=5.0)
VLDL: 9 mg/dL (ref <30)

## 2016-01-17 NOTE — Progress Notes (Signed)
Subjective:   HPI  [name] is a 55 y.o. male who presents for a complete physical.  Normally sees Dr.Lalonde here.    Concerns: Had tick bite in August, but ended up going to the hospital, having tick cut out.  Has some ongoing low abdominal pain and some intermittent low back pain since then.  Gets worse pain at times with having to hold urine.   No blood in urine or stool.  Reviewed CT abd/pelvis from 08/2015 showing fatty liver, otherwise normal.  06/2015 lumbar xray shows spondylosis.   Since the CT had ultrasound and additional xrays of low back and abdomen at [facility].  No other aggravating or relieving factors. No other complaint.  Reviewed their medical, surgical, family, social, medication, and allergy history and updated chart as appropriate.  Past Medical History  Diagnosis Date  . Hyperlipidemia   . GERD (gastroesophageal reflux disease)   . Low back pain   . Sacroiliac pain   . Fatty liver disease, nonalcoholic     Past Surgical History  Procedure Laterality Date  . 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 Knee surgery      bilateral  . Foot surgery    . Wrist surgery    . Colonoscopy      never as of 12/2015    Social History   Social History  . Marital Status: Married    Spouse Name: N/A  . Number of Children: N/A  . Years of Education: N/A   Occupational History  . Not on file.   Social History Main Topics  . Smoking status: Never Smoker   . Smokeless tobacco: Never Used  . Alcohol Use: Yes     Comment: socially  . Drug Use: No  . Sexual Activity: Yes   Other Topics Concern  . Not on file   Social History Narrative    Family History  Problem Relation Age of Onset  . Asthma Mother   . Arthritis Mother   . Diabetes Maternal Aunt      Current outpatient prescriptions:  .  ibuprofen (ADVIL,MOTRIN) 200 MG tablet, Take 200 mg by mouth as needed. Reported on 01/17/2016, Disp: , Rfl:  .  ranitidine (ZANTAC) 150 MG  tablet, Take 150 mg by mouth daily. Reported on 11/04/2015, Disp: , Rfl:   Allergies  Allergen Reactions  . Codeine Nausea And Vomiting  . Percocet [Oxycodone-Acetaminophen] Nausea Only    Review of Systems Constitutional: -fever, -chills, -sweats, -unexpected weight change, -decreased appetite, -fatigue Allergy: -sneezing, -itching, -congestion Dermatology: -changing moles, --rash, -lumps ENT: -runny nose, -ear pain, -sore throat, -hoarseness, -sinus pain, -teeth pain, - ringing in ears, -hearing loss, -nosebleeds Cardiology: -chest pain, -palpitations, -swelling, -difficulty breathing when lying flat, -waking up short of breath Respiratory: -cough, -shortness of breath, -difficulty breathing with exercise or exertion, -wheezing, -coughing up blood Gastroenterology: +abdominal pain, -nausea, -vomiting, -diarrhea, -constipation, -blood in stool, -changes in bowel movement, -difficulty swallowing or eating Hematology: -bleeding, -bruising  Musculoskeletal: -joint aches, -muscle aches, -joint swelling, -back pain, -neck pain, -cramping, -changes in gait Ophthalmology: denies vision changes, eye redness, itching, discharge Urology: -burning with urination, -difficulty urinating, -blood in urine, -urinary frequency, -urgency, -incontinence Neurology: -headache, -weakness, -tingling, -numbness, -memory loss, -falls, -dizziness Psychology: -depressed mood, -agitation, -sleep problems     Objective:   Physical Exam  BP 118/70 mmHg  Pulse 69  Ht 5' 10.25" (1.784 m)  Wt 184 lb (83.462 kg)  BMI  26.22 kg/m2  Wt Readings from Last 3 Encounters:  01/17/16 184 lb (83.462 kg)  11/04/15 187 lb 9.6 oz (85.095 kg)  09/24/15 177 lb (80.287 kg)   General appearance: alert, no distress, WD/WN, white male Skin: left upper back brown raised rough 3mm diameter lesion, left lower back pink/yellow crusted raised 3mm diameter lesion, scattered macules, small sebaceous cyst of right low back/flank  unchanged for years per patient HEENT: normocephalic, conjunctiva/corneas normal, sclerae anicteric, PERRLA, EOMi, nares patent, no discharge or erythema, pharynx normal Oral cavity: MMM, tongue normal, teeth upper denture, lower teeth with moderate plaque Neck: supple, no lymphadenopathy, no thyromegaly, no masses, normal ROM, no bruits Chest: non tender, normal shape and expansion, slight fatty increase in left breast, but no nodules, biopsy scar left medial breast at 7 o'clock Heart: RRR, normal S1, S2, no murmurs Lungs: CTA bilaterally, no wheezes, rhonchi, or rales Abdomen: +bs, soft, mild LLQ tenderness, non tender, non distended, no masses, no hepatomegaly, no splenomegaly, no bruits Back: non tender, normal ROM, no scoliosis Musculoskeletal: upper extremities non tender, no obvious deformity, normal ROM throughout, lower extremities non tender, no obvious deformity, normal ROM throughout Extremities: no edema, no cyanosis, no clubbing Pulses: 2+ symmetric, upper and lower extremities, normal cap refill Neurological: alert, oriented x 3, CN2-12 intact, strength normal upper extremities and lower extremities, sensation normal throughout, DTRs 2+ throughout, no cerebellar signs, gait normal Psychiatric: normal affect, behavior normal, pleasant  GU: normal male external genitalia, circumcised, spermatocele left scrotum superior to testicle,no other mass, non tender, no masses, no hernia, no lymphadenopathy Rectal:  deferred   Assessment and Plan :    Encounter Diagnoses  Name Primary?  . Encounter for health maintenance examination in adult Yes  . Gastroesophageal reflux disease without esophagitis   . Fatty liver disease, nonalcoholic   . Special screening for malignant neoplasms, colon   . Screening for prostate cancer   . Skin lesion   . Gynecomastia   . Sebaceous cyst   . Lower abdominal pain    Physical exam - discussed healthy lifestyle, diet, exercise, preventative care,  vaccinations, and addressed their concerns.   See your eye doctor yearly for routine vision care. See your dentist yearly for routine dental care including hygiene visits twice yearly. Referred for colonoscopy in [place] Return for skin biopsies of the 2 back lesions advised weight bearing exercise and calisthenics for helping with back and belly pain which seems to be musculoskeletal in nature Reviewed recent labs and CT and no obvious abdominal findings of concern Gynecomastia - cut back on alcohol and fatty foods.  He eats a fair amount of burgers and hot dogs.  Follow-up pending labs

## 2016-01-18 ENCOUNTER — Other Ambulatory Visit: Payer: Self-pay | Admitting: Family Medicine

## 2016-01-18 DIAGNOSIS — N62 Hypertrophy of breast: Secondary | ICD-10-CM

## 2016-01-18 LAB — PSA: PSA: 0.68 ng/mL (ref <=4.00)

## 2016-01-26 ENCOUNTER — Ambulatory Visit
Admission: RE | Admit: 2016-01-26 | Discharge: 2016-01-26 | Disposition: A | Discharge status: Home / Self Care | Payer: BLUE CROSS/BLUE SHIELD | Source: Ambulatory Visit | Attending: Family Medicine | Admitting: Family Medicine

## 2016-01-26 DIAGNOSIS — N62 Hypertrophy of breast: Secondary | ICD-10-CM

## 2016-02-14 ENCOUNTER — Telehealth: Payer: Self-pay | Admitting: Family Medicine

## 2016-02-14 NOTE — Telephone Encounter (Signed)
Have him f/u with one of us/ recheck OV please.

## 2016-02-14 NOTE — Telephone Encounter (Signed)
[facility]

## 2016-02-14 NOTE — Telephone Encounter (Signed)
Pt still having groin pain that [facility] Dr Susann Givens[facility]alonde. Pain is radiating to hip now. Pt wants to know what he should do now

## 2016-02-16 ENCOUNTER — Ambulatory Visit (INDEPENDENT_AMBULATORY_CARE_PROVIDER_SITE_OTHER): Payer: BLUE CROSS/BLUE SHIELD | Admitting: Medical

## 2016-02-16 ENCOUNTER — Encounter: Payer: Self-pay | Admitting: Medical

## 2016-02-16 VITALS — BP 130/80 | HR 71 | Wt 185.0 lb

## 2016-02-16 DIAGNOSIS — M545 Low back pain, unspecified: Secondary | ICD-10-CM

## 2016-02-16 DIAGNOSIS — R3915 Urgency of urination: Secondary | ICD-10-CM

## 2016-02-16 DIAGNOSIS — R140 Abdominal distension (gaseous): Secondary | ICD-10-CM | POA: Diagnosis not present

## 2016-02-16 DIAGNOSIS — R350 Frequency of micturition: Secondary | ICD-10-CM | POA: Diagnosis not present

## 2016-02-16 DIAGNOSIS — R1032 Left lower quadrant pain: Secondary | ICD-10-CM | POA: Diagnosis not present

## 2016-02-16 DIAGNOSIS — K5900 Constipation, unspecified: Secondary | ICD-10-CM | POA: Diagnosis not present

## 2016-02-16 LAB — POCT URINALYSIS DIPSTICK
BILIRUBIN UA: NEGATIVE
GLUCOSE UA: NEGATIVE
KETONES UA: NEGATIVE
Leukocytes, UA: NEGATIVE
Nitrite, UA: NEGATIVE
Protein, UA: NEGATIVE
RBC UA: NEGATIVE
SPEC GRAV UA: 1.01
Urobilinogen, UA: NEGATIVE
pH, UA: 6.5

## 2016-02-16 MED ORDER — LINACLOTIDE 145 MCG PO CAPS: 145.0000 ug | ORAL_CAPSULE | Freq: Every day | ORAL | Status: DC

## 2016-02-16 MED ORDER — TAMSULOSIN HCL 0.4 MG PO CAPS: 0.4000 mg | ORAL_CAPSULE | Freq: Every day | ORAL | Status: DC

## 2016-02-16 NOTE — Patient Instructions (Signed)
Encounter Diagnoses  Name Primary?  . Groin pain, left Yes  . Constipation, unspecified constipation type   . Abdominal bloating   . Urinary urgency   . Urinary frequency   . Bilateral low back pain without sciatica    Recommendations  Your symptoms have an unclear cause, but there may be some possible answers  First, I see no sign of hernia, and your recent CT abdomen/pelvis shows no hernia  However, your scan did show a lot of poop in your colon  You have urinary and bowel symptoms  You have history of low back pain which can also cause lower abdomen pain at times  You have an upcoming colonoscopy which will also help to give us answers  I don't think any of these symptoms have anything to do with the tick bite  Your urine findings are normal today.  I reviewed your recent labs  You may have mild prostate enlargement symptoms based on the urinary frequency and urgency.  Thus, I want you to start a trial of Flomax once daily in the evening to see if this helps urinary symptoms  I want you to begin once daily Linzess samples in the morning to help with water absorption in the colon, which will hopefully help with bowel movements, bloating, gas, and belly discomfort  Make sure you are getting good fiber intake and at least 64 oz water daily  Hip pain/hip arthritis can also cause groin pain, but you have no obvious signs of this  I would like to see you back in 3-4 week to see if symptoms improve

## 2016-02-16 NOTE — Progress Notes (Signed)
Subjective: Chief Complaint  Patient presents with  . groin pain    on hip and in groin. has had scans by us and seen for this in the past still thinks it is related to the tick bite   Here for left groin/abdominal pain.  Has been seen here prior for back and abdominal pains.    This is an ongoing problem.   He thinks this first started when a tick bit him on left lower abdomen.   Started having problems then.   He notes pain in left groin and left low abdomen.    Has a knot of fullness in same area.   Stomach feels bloated often, strains with BMs.   Has BM daily.   Doesn't have sense of complete emptying with BMs.   Denies dry or hard bulky stool.   Used some milk of magnesia recently which helped relieve bloating.   Denies blood in stool.  First colonoscopy is scheduled for 03/17/16.   He notes that he eats a lot of cheese.   He endorses urinary urgency, frequency, but no burning or pain with urination.  No blood in urine, no cloudy urine.  No hesitancy.   No dribbling.   Gets up 2 times per night to urinate.   No anus pain, no rectal fullness, no fever, no scrotal pain or swelling. No penile discharge.  No other aggravating or relieving factors. No other complaint.   Past Medical History  Diagnosis Date  . Hyperlipidemia   . GERD (gastroesophageal reflux disease)   . Low back pain   . Sacroiliac pain   . Fatty liver disease, nonalcoholic    Past Surgical History  Procedure Laterality Date  . 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 Knee surgery      bilateral  . Foot surgery    . Wrist surgery    . Colonoscopy      never as of 12/2015    ROS as in subjective   Objective: BP 130/80 mmHg  Pulse 71  Wt 185 lb (83.915 kg)  General appearance: alert, no distress, WD/WN Abdomen: +bs, soft, there is mild tendernerss left mid and low abdomen, a slight fullness noted, but when he is contracting abdominal muscles, no fullness or pain or mas.  Otherwise non tender, non  distended, no masses, no hepatomegaly, no splenomegaly, no obvious hernia GU: normal male, circumcised, no hernia, no mass, no lymphadenopathy DRE: declined Left hip nontender, normal ROM, otherwise legs unremarkable Pulses: 2+ symmetric, upper and lower extremities, normal cap refill Ext; no edema    Assessment: Encounter Diagnoses  Name Primary?  . Groin pain, left Yes  . Constipation, unspecified constipation type   . Abdominal bloating   . Urinary urgency   . Urinary frequency   . Bilateral low back pain without sciatica     Plan: Discussed his symptoms, findings, recent labs and CT abdomen/pelvis.  I suspect his symptoms are GI related, but possibly mild BPH symptoms too.    Patient Instructions   Encounter Diagnoses  Name Primary?  . Groin pain, left Yes  . Constipation, unspecified constipation type   . Abdominal bloating   . Urinary urgency   . Urinary frequency   . Bilateral low back pain without sciatica    Recommendations  Your symptoms have an unclear cause, but there may be some possible answers  First, I see no sign of hernia, and your recent CT abdomen/pelvis shows  no hernia  However, your scan did show a lot of poop in your colon  You have urinary and bowel symptoms  You have history of low back pain which can also cause lower abdomen pain at times  You have an upcoming colonoscopy which will also help to give [place] answers  I don't think any of these symptoms have anything to do with the tick bite  Your urine findings are normal today.  I reviewed your recent labs  You may have mild prostate enlargement symptoms based on the urinary frequency and urgency.  Thus, I want you to start a trial of Flomax once daily in the evening to see if this helps urinary symptoms  I want you to begin once daily Linzess samples in the morning to help with water absorption in the colon, which will hopefully help with bowel movements, bloating, gas, and belly  discomfort  Make sure you are getting good fiber intake and at least 64 oz water daily  Hip pain/hip arthritis can also cause groin pain, but you have no obvious signs of this  I would like to see you back in 3-4 week to see if symptoms improve  Pete was seen today for groin pain.  Diagnoses and all orders for this visit:  Groin pain, left -     POCT urinalysis dipstick  Constipation, unspecified constipation type  Abdominal bloating  Urinary urgency  Urinary frequency  Bilateral low back pain without sciatica  Other orders -     tamsulosin (FLOMAX) 0.4 MG CAPS capsule; Take 1 capsule (0.4 mg total) by mouth daily. -     linaclotide (LINZESS) 145 MCG CAPS capsule; Take 1 capsule (145 mcg total) by mouth daily before breakfast.

## 2016-02-23 ENCOUNTER — Telehealth: Payer: Self-pay

## 2016-02-23 NOTE — Telephone Encounter (Signed)
Called patient because we received a fax from [facility] @ [place]. After looking at the Care Everywhere tab, it showed he was seeing us both even sometimes in the same day for the same problems. When asking the patient he said we where his primary care and that he only went once to get a referral for his mammogram. He never addressed the other visits just said that he thought that office was a "mini hospital" and that he did not plan on going back. I explained it was only a problem because you can not have two primary care's because not all providers agree on treatment options and plans which can cause a lot of contradictions. Pt stated he understood and wanted to stay with our office for primary care.

## 2016-03-30 ENCOUNTER — Telehealth: Payer: Self-pay | Admitting: Medical

## 2016-03-30 NOTE — Telephone Encounter (Signed)
Pt states he still having issues & GI doctor states they don't think it is GI related & he was questioning whether to have the colonoscopy or not.  It is scheduled for tomorrow.  Advised per [facility] notes state he recommend the colonoscopy. He will go ahead and have this done & call us back after he gets the results.   LM

## 2016-03-31 NOTE — Telephone Encounter (Signed)
Yes he still needs a colonoscopy  Otherwise, next step would be to see either urology for pelvic pain or PT for some therapy to see if this helps his pains, assuming possibly an inflamed nerve issue in the low back, but needs to start with colonoscopy.

## 2016-04-03 NOTE — Telephone Encounter (Signed)
Pt called back and states that his colonoscopy come back good but his still having pain and has that knot. He is not for sure what to do, pt would like you to call and talk with him on what he needs to do next,pt can be reached at [phone] would like a call today after 2.

## 2016-04-03 NOTE — Telephone Encounter (Signed)
[facility] to [facility] and pt is aware, he wants to wait on PT referral

## 2016-04-03 NOTE — Telephone Encounter (Signed)
Looking back over last note, it wasn't clear what the issue was.  If GI said its not GI related and since the CT scan was normal, the assumption is that it could be related to back arthritis or a urologic issue.   There was no hernia on CT or exam.   I recommend either PT referral for back and pelvic pain or urology referral.

## 2016-04-03 NOTE — Telephone Encounter (Signed)
What is the main symptoms he is having currently? Back pain, pelvic pain?  I read back over last visit notes.   I recommend physical therapy referral for low back pain, pelvic pain to help see if this improves some of his symptoms.  Is he taking Linzess to help prevent constipation?  Is he taking any of the recommendations I had for prostate enlargement?

## 2016-04-03 NOTE — Telephone Encounter (Signed)
Still having the knot on his lt side and pain in his groin. States that he is not taking the Linzess because he had erectile problems because of it.

## 2016-04-10 ENCOUNTER — Emergency Department
Admission: EM | Admit: 2016-04-10 | Discharge: 2016-04-10 | Disposition: A | Discharge status: Home / Self Care | Payer: BLUE CROSS/BLUE SHIELD | Attending: Emergency Medicine | Admitting: Emergency Medicine

## 2016-04-10 ENCOUNTER — Encounter: Payer: Self-pay | Admitting: Emergency Medicine

## 2016-04-10 DIAGNOSIS — Z5321 Procedure and treatment not carried out due to patient leaving prior to being seen by health care provider: Secondary | ICD-10-CM | POA: Insufficient documentation

## 2016-04-10 DIAGNOSIS — Z79899 Other long term (current) drug therapy: Secondary | ICD-10-CM | POA: Diagnosis not present

## 2016-04-10 DIAGNOSIS — R1032 Left lower quadrant pain: Secondary | ICD-10-CM | POA: Diagnosis present

## 2016-04-10 LAB — CBC
HEMATOCRIT: 38.2 % — AB (ref 40.0–52.0)
Hemoglobin: 13.4 g/dL (ref 13.0–18.0)
MCH: 32 pg (ref 26.0–34.0)
MCHC: 35 g/dL (ref 32.0–36.0)
MCV: 91.6 fL (ref 80.0–100.0)
Platelets: 200 10*3/uL (ref 150–440)
RBC: 4.17 MIL/uL — ABNORMAL LOW (ref 4.40–5.90)
RDW: 12.9 % (ref 11.5–14.5)
WBC: 6.4 10*3/uL (ref 3.8–10.6)

## 2016-04-10 LAB — COMPREHENSIVE METABOLIC PANEL
ALBUMIN: 4.7 g/dL (ref 3.5–5.0)
ALT: 26 U/L (ref 17–63)
AST: 25 U/L (ref 15–41)
Alkaline Phosphatase: 65 U/L (ref 38–126)
Anion gap: 8 (ref 5–15)
BILIRUBIN TOTAL: 0.9 mg/dL (ref 0.3–1.2)
BUN: 10 mg/dL (ref 6–20)
CO2: 24 mmol/L (ref 22–32)
Calcium: 9.1 mg/dL (ref 8.9–10.3)
Chloride: 105 mmol/L (ref 101–111)
Creatinine, Ser: 1.01 mg/dL (ref 0.61–1.24)
GFR calc Af Amer: >60 mL/min (ref >60)
GFR calc non Af Amer: >60 mL/min (ref >60)
GLUCOSE: 110 mg/dL — AB (ref 65–99)
POTASSIUM: 3.4 mmol/L — AB (ref 3.5–5.1)
Sodium: 137 mmol/L (ref 135–145)
TOTAL PROTEIN: 7.1 g/dL (ref 6.5–8.1)

## 2016-04-10 LAB — URINALYSIS COMPLETE WITH MICROSCOPIC (ARMC ONLY)
BACTERIA UA: NONE SEEN
BILIRUBIN URINE: NEGATIVE
GLUCOSE, UA: NEGATIVE mg/dL
Hgb urine dipstick: NEGATIVE
Ketones, ur: NEGATIVE mg/dL
Leukocytes, UA: NEGATIVE
Nitrite: NEGATIVE
Protein, ur: NEGATIVE mg/dL
RBC / HPF: NONE SEEN RBC/hpf (ref 0–5)
SQUAMOUS EPITHELIAL / LPF: NONE SEEN
Specific Gravity, Urine: 1.004 — ABNORMAL LOW (ref 1.005–1.030)
WBC, UA: NONE SEEN WBC/hpf (ref 0–5)
pH: 6 (ref 5.0–8.0)

## 2016-04-10 LAB — LIPASE, BLOOD: Lipase: 28 U/L (ref 11–51)

## 2016-04-10 NOTE — ED Notes (Signed)
C/o LLQ pain.  Patient states he has been through ED twice for same.  Last seen 4-5 months ago.  Symptoms have been present since December.

## 2016-05-08 ENCOUNTER — Telehealth: Payer: Self-pay | Admitting: *Deleted

## 2016-05-08 NOTE — Telephone Encounter (Signed)
Pt was transferred to the nurse line, he has severe pain on his left side, he stated that he had a tick bite previously

## 2016-05-09 ENCOUNTER — Telehealth: Payer: Self-pay | Admitting: Medical

## 2016-05-09 ENCOUNTER — Emergency Department (HOSPITAL_COMMUNITY)
Admission: EM | Admit: 2016-05-09 | Discharge: 2016-05-09 | Disposition: A | Discharge status: Home / Self Care | Payer: BLUE CROSS/BLUE SHIELD | Attending: Emergency Medicine | Admitting: Emergency Medicine

## 2016-05-09 ENCOUNTER — Encounter (HOSPITAL_COMMUNITY): Payer: Self-pay | Admitting: Neurology

## 2016-05-09 DIAGNOSIS — Z96639 Presence of unspecified artificial wrist joint: Secondary | ICD-10-CM | POA: Diagnosis not present

## 2016-05-09 DIAGNOSIS — R102 Pelvic and perineal pain: Secondary | ICD-10-CM

## 2016-05-09 DIAGNOSIS — Z96659 Presence of unspecified artificial knee joint: Secondary | ICD-10-CM | POA: Insufficient documentation

## 2016-05-09 DIAGNOSIS — R1032 Left lower quadrant pain: Secondary | ICD-10-CM | POA: Insufficient documentation

## 2016-05-09 DIAGNOSIS — M545 Low back pain: Secondary | ICD-10-CM

## 2016-05-09 MED ORDER — CYCLOBENZAPRINE HCL 10 MG PO TABS: 10.0000 mg | ORAL_TABLET | Freq: Two times a day (BID) | ORAL | Status: DC | PRN

## 2016-05-09 NOTE — Telephone Encounter (Signed)
At this point given that the symptoms persist, I recommend a referral to back specialist.   If he wants to do MRI first, then set up L spine MRI.

## 2016-05-09 NOTE — ED Notes (Signed)
Pt reports last august was bit by a tick to his left flank,, since then he reports he has had a "bulge" to his left flank when he stands and it is painful. Reports he has had CT, US, colonoscopy unable to determine the cause. Denies urinary s/s, denies n/v/d. Last BM today. Reports referral in September to see urology. Pain is all the time and takes 2 ibuprofen daily.

## 2016-05-09 NOTE — Discharge Instructions (Signed)

## 2016-05-09 NOTE — ED Provider Notes (Signed)
CSN: [id number]     Arrival date & time 05/09/16  0753 History   First MD Initiated Contact with Patient 05/09/16 0809     Chief Complaint  Patient presents with  . Mass     (Consider location/radiation/quality/duration/timing/severity/associated sxs/prior Treatment) Patient is a 55 y.o. male presenting with abdominal pain. The history is provided by the patient.  Abdominal Pain Pain location:  LLQ Pain quality: aching   Pain radiates to:  Does not radiate Pain severity:  Moderate Onset quality:  Gradual Timing:  Intermittent Progression:  Waxing and waning Chronicity:  Chronic Associated symptoms: no chest pain, no chills, no cough, no diarrhea, no fever, no nausea, no shortness of breath, no sore throat and no vomiting     Past Medical History  Diagnosis Date  . Hyperlipidemia   . GERD (gastroesophageal reflux disease)   . Low back pain   . Sacroiliac pain   . Fatty liver disease, nonalcoholic    Past Surgical History  Procedure Laterality Date  . 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 Knee surgery      bilateral  . Foot surgery    . Wrist surgery    . Colonoscopy      never as of 12/2015   Family History  Problem Relation Age of Onset  . Asthma Mother   . Arthritis Mother   . Diabetes Maternal Aunt    Social History  Substance Use Topics  . Smoking status: Never Smoker   . Smokeless tobacco: Never Used  . Alcohol Use: Yes     Comment: socially    Review of Systems  Constitutional: Negative for fever and chills.  HENT: Negative for congestion and sore throat.   Eyes: Negative for pain.  Respiratory: Negative for cough and shortness of breath.   Cardiovascular: Negative for chest pain and palpitations.  Gastrointestinal: Positive for abdominal pain. Negative for nausea, vomiting, diarrhea and blood in stool.  Endocrine: Negative.   Genitourinary: Negative for flank pain, penile swelling, scrotal swelling and testicular pain.   Musculoskeletal: Negative for back pain and neck pain.       Left hip pain  Skin: Negative for rash.  Allergic/Immunologic: Negative.   Neurological: Negative for dizziness, syncope and light-headedness.  Psychiatric/Behavioral: Negative for confusion.      Allergies  Codeine and Percocet  Home Medications   Prior to Admission medications   Medication Sig Start Date End Date Taking? Authorizing Provider  ibuprofen (ADVIL,MOTRIN) 200 MG tablet Take 400 mg by mouth 3 (three) times daily as needed (pain). Reported on 01/17/2016   Yes Historical Provider, MD  cyclobenzaprine (FLEXERIL) 10 MG tablet Take 1 tablet (10 mg total) by mouth 2 (two) times daily as needed for muscle spasms. 05/09/16   Tery Sanfilippo, MD  linaclotide Karlene Einstein) 145 MCG CAPS capsule Take 1 capsule (145 mcg total) by mouth daily before breakfast. Patient not taking: Reported on 05/09/2016 02/16/16   Kermit Balo Tysinger, PA-C  ranitidine (ZANTAC) 150 MG tablet Take 150 mg by mouth daily. Reported on 11/04/2015    Historical Provider, MD  tamsulosin (FLOMAX) 0.4 MG CAPS capsule Take 1 capsule (0.4 mg total) by mouth daily. Patient not taking: Reported on 05/09/2016 02/16/16   Kermit Balo Tysinger, PA-C   BP 130/70 mmHg  Pulse 64  Temp(Src) 97.9 F (36.6 C) (Oral)  Resp 18  SpO2 96% Physical Exam  Constitutional: He is oriented to person, place, and time. He appears well-developed  and well-nourished.  HENT:  Head: Normocephalic and atraumatic.  Eyes: Conjunctivae and EOM are normal. Pupils are equal, round, and reactive to light.  Neck: Normal range of motion. Neck supple.  Cardiovascular: Normal rate, regular rhythm, normal heart sounds and intact distal pulses.   Pulses:      Femoral pulses are 2+ on the right side, and 2+ on the left side.      Dorsalis pedis pulses are 2+ on the left side.       Posterior tibial pulses are 2+ on the left side.  Pulmonary/Chest: Effort normal and breath sounds normal. No respiratory  distress.  Abdominal: Soft. Bowel sounds are normal. There is tenderness in the left lower quadrant. There is no rigidity, no rebound, no guarding, no CVA tenderness, no tenderness at McBurney's point and negative Murphy's sign.  Musculoskeletal: Normal range of motion.       Left hip: Normal. He exhibits normal range of motion.  Neurological: He is alert and oriented to person, place, and time. He has normal reflexes. No cranial nerve deficit.  Skin: Skin is warm and dry.  No rash    ED Course  Procedures (including critical care time) Labs Review Labs Reviewed - No data to display  Imaging Review No results found. I have personally reviewed and evaluated these images and lab results as part of my medical decision-making.   EKG Interpretation None      MDM   Final diagnoses:  Left lower quadrant pain   The patient is a 55 year old male presented today for chronic left lower quadrant and left hip pain. He reports that 8 months ago he was bit by a tick which remained on his body for greater than a month subsequently a month later he developed left lower quadrant pain and feels like there is an outpouching in his left lower quadrant. Reports aggravated with movement and alleviated with rest. No GI, GU, or skin symptoms.  On evaluation the patient is hemodynamic was stable and in no acute distress. No evidence of hernia on physical examination. GU exam within normal limits. Pulses equal and doubt atypical dissection. Normal range of motion of hip and lower suspicion for arthritic etiology. Afebrile and full range of motion and able to bear weight and low suspicion for septic joint or transient tenosynovitis. Patient has had negative CT scans before and doubt diverticulitis. Patient also reports recent colonoscopy.  On review of records she has been seen roughly 10 times for this in the past by both general surgery and family medicine for same problem. He is scheduled to see  gastroenterology for this as well. Low suspicion for any acute life-threatening injury and lower suspicion for chronic Lyme disease as well. Possible musculoskeletal strain and given patient Rx for Flexeril for attempted symptomatic relief with follow-up with PCP.  Previous image reads and labs were viewed by myself  incorporated into medical decision making.  Discussed pertinent finding with patient or caregiver prior to discharge with no further questions.  Immediate return precautions given and understood.  Medical decision making supervised by my attending Dr. Silas SacramentoZavitz  Augustin Bun, MD PGY-3 Emergency Medicine      Tery SanfilippoMatthew Karthika Glasper, MD 05/09/16 [id number]  Blane OharaJoshua Zavitz, MD 05/09/16 1544

## 2016-05-09 NOTE — Telephone Encounter (Signed)
Pt called and stated that he has had to go to the ER twice recently concerning this on going pain he has. Pt states that the ER doctor stated to him that he should possibly have a MRI. Pt [facility]. Pt states that he doesn't have the money and already has a balance with us. Pt would like to discuss the possibility of a MRI and if it will help and possible getting that scheduled. Please call pt at [phone].

## 2016-05-10 NOTE — Telephone Encounter (Signed)
Pt will call after Urology appt in order to discuss

## 2016-05-10 NOTE — Telephone Encounter (Signed)
Regarding the skin knot, if he wants we can refer to dermatology.  I have nothing to treat it with that would help.  Regarding other tick illness as mentioned in last message, we can do other tick borne illness tests if having fatigue, aches, not feeling well, fevers.

## 2016-05-10 NOTE — Telephone Encounter (Signed)
When I saw him there was ongoing groin pain in the setting of prior back pain.   Thus, MRI of back would be appropriate since this looks at nerves and discs in the low back which can impact sensation and pain in groin/back.    I can't MRI his whole body.     We can check some additional tick related blood tests if he wants to do this first.    Otherwise we may need to get someone else to help in the evaluation.

## 2016-05-10 NOTE — Telephone Encounter (Signed)
Has appt with Urology tomorrow and will call after appt and let us know what he wants to do. Wants to know why his knot is still there. It makes his whole stomach hurt. Thinking it is his muscles. Will discuss with urologist tomorrrow about concerns as well. Decisions will be made tomorrow.

## 2016-05-10 NOTE — Telephone Encounter (Signed)
He has had it for 8+ months states pt

## 2016-05-10 NOTE — Telephone Encounter (Signed)
Pt stated he does not need to see a back specialist because it is not back pain. [name] that it is where that knot is from the tick bite. Wants a MRI of his whole body since no one can figure out what is wrong with him. From his chest to his knees preferrably for the MRI

## 2016-05-10 NOTE — Telephone Encounter (Signed)
If he is referring to knot at site of tick bite, that can persist for month but usually gradually/slowly goes away.  In contrast, mosquito bites usually resolve quickly.

## 2016-05-11 ENCOUNTER — Other Ambulatory Visit: Payer: Self-pay | Admitting: Medical

## 2016-05-11 ENCOUNTER — Ambulatory Visit: Payer: Self-pay | Admitting: Urology

## 2016-05-11 DIAGNOSIS — M545 Low back pain: Secondary | ICD-10-CM

## 2016-05-11 DIAGNOSIS — R102 Pelvic and perineal pain: Principal | ICD-10-CM

## 2016-05-11 DIAGNOSIS — G8929 Other chronic pain: Secondary | ICD-10-CM

## 2016-05-11 NOTE — Telephone Encounter (Signed)
Pt called and stated that his Urologist called him and canceled his appt. Per shane now we will do a pelvic and L spine MRI will call insurance to see about prior auth and then call pt

## 2016-05-11 NOTE — Telephone Encounter (Signed)
[facility]

## 2016-05-17 ENCOUNTER — Encounter: Payer: Self-pay | Admitting: Medical

## 2016-05-17 NOTE — Telephone Encounter (Signed)
See letter.

## 2016-05-17 NOTE — Telephone Encounter (Signed)
Faxed to [facility]

## 2016-05-29 ENCOUNTER — Telehealth: Payer: Self-pay

## 2016-05-29 ENCOUNTER — Other Ambulatory Visit: Payer: Self-pay | Admitting: Family Medicine

## 2016-05-29 NOTE — Telephone Encounter (Signed)
Then put in the right imaging order and if contrast needed to rule out mass, then yes I want contrast. I hope this clear this up.

## 2016-05-29 NOTE — Telephone Encounter (Signed)
Fine to use contrast if needed

## 2016-05-29 NOTE — Telephone Encounter (Signed)
They are asking if its necessary to use contrast. They usually don't use contrast for associated dx code.

## 2016-05-29 NOTE — Telephone Encounter (Signed)
[facility] MRI supervisor called to verify if contrast is needed on MRI for Wed. She states usually they only use contrast to r/o masses. Please advise so we can call her at [phone]. [name]

## 2016-05-30 ENCOUNTER — Telehealth: Payer: Self-pay

## 2016-05-30 NOTE — Telephone Encounter (Signed)
Per [name] does want pt to have MRI with contrast due to unknown cause of pain in pelvis. [name]

## 2016-05-30 NOTE — Telephone Encounter (Signed)
Patient called and wanted to know what the MRI consist of I explained it to him and he was complaining about cost and I told him I knew they were high but hopfuly they can find out what is wrong with him he then Thanked me and hung up

## 2016-05-31 ENCOUNTER — Ambulatory Visit
Admission: RE | Admit: 2016-05-31 | Discharge: 2016-05-31 | Disposition: A | Discharge status: Home / Self Care | Payer: BLUE CROSS/BLUE SHIELD | Source: Ambulatory Visit | Attending: Medical | Admitting: Medical

## 2016-05-31 MED ORDER — GADOBENATE DIMEGLUMINE 529 MG/ML IV SOLN
17.0000 mL | Freq: Once | INTRAVENOUS | Status: AC | PRN
Start: 2016-05-31 — End: 2016-05-31
  Administered 2016-05-31: 17 mL via INTRAVENOUS

## 2016-06-08 NOTE — Progress Notes (Signed)
done

## 2016-07-19 ENCOUNTER — Telehealth: Payer: Self-pay | Admitting: Medical

## 2016-07-19 NOTE — Telephone Encounter (Signed)
Pt called stating that he is having kidney area pain and would like to speak to Vincenza HewsShane about it since this is something that he's had in the past and pt is not able to get off work for an appointment today

## 2016-07-19 NOTE — Telephone Encounter (Signed)
Please call, get symptoms update, what symptom, how long has it been occurring, let me know

## 2016-07-19 NOTE — Telephone Encounter (Signed)
Spoke to patient:  x4 weeks Sx(s): soreness above hip on L side Urine smells bad

## 2016-07-20 ENCOUNTER — Telehealth: Payer: Self-pay | Admitting: Family Medicine

## 2016-07-20 NOTE — Telephone Encounter (Signed)
Pt informed and verbalized understanding

## 2016-07-20 NOTE — Telephone Encounter (Addendum)
Pt said he got dizzy, little lightheaded, nauseated at work. Nurse checked him at work. B/P was 130/64. Nurse did not find anything wrong and told pt he may be exhausted. Pt wants to know if this is possible or what he needs to do. He is still slightly dizzy & nauseated now. Pt would like to be called back today if possible

## 2016-07-20 NOTE — Telephone Encounter (Signed)
Tell him to make sure that he stays hydrated. He may take the over-the-counter meclizine with the dizziness. If he continues, have him set up an appointment

## 2016-07-21 ENCOUNTER — Other Ambulatory Visit: Payer: Self-pay

## 2016-07-21 ENCOUNTER — Other Ambulatory Visit: Payer: Self-pay | Admitting: Medical

## 2016-07-21 MED ORDER — CIPROFLOXACIN HCL 500 MG PO TABS: 500.0000 mg | ORAL_TABLET | Freq: Two times a day (BID) | ORAL | 0 refills | Status: DC

## 2016-07-21 NOTE — Telephone Encounter (Signed)
Have him do a week of Cipro BID antibiotic and f/u in 1 wk

## 2016-07-21 NOTE — Telephone Encounter (Signed)
Pt informed and verbalized understanding and said he would call back to make appointment

## 2016-08-15 ENCOUNTER — Emergency Department
Admission: EM | Admit: 2016-08-15 | Discharge: 2016-08-15 | Disposition: A | Discharge status: Home / Self Care | Payer: BLUE CROSS/BLUE SHIELD | Attending: Emergency Medicine | Admitting: Emergency Medicine

## 2016-08-15 ENCOUNTER — Encounter: Payer: Self-pay | Admitting: Emergency Medicine

## 2016-08-15 DIAGNOSIS — Z79899 Other long term (current) drug therapy: Secondary | ICD-10-CM | POA: Insufficient documentation

## 2016-08-15 DIAGNOSIS — R1032 Left lower quadrant pain: Secondary | ICD-10-CM | POA: Insufficient documentation

## 2016-08-15 DIAGNOSIS — R140 Abdominal distension (gaseous): Secondary | ICD-10-CM | POA: Diagnosis not present

## 2016-08-15 DIAGNOSIS — Z791 Long term (current) use of non-steroidal anti-inflammatories (NSAID): Secondary | ICD-10-CM | POA: Insufficient documentation

## 2016-08-15 LAB — URINALYSIS COMPLETE WITH MICROSCOPIC (ARMC ONLY)
Bacteria, UA: NONE SEEN
Bilirubin Urine: NEGATIVE
Glucose, UA: NEGATIVE mg/dL
Hgb urine dipstick: NEGATIVE
KETONES UR: NEGATIVE mg/dL
LEUKOCYTES UA: NEGATIVE
Nitrite: NEGATIVE
PH: 6 (ref 5.0–8.0)
PROTEIN: NEGATIVE mg/dL
SQUAMOUS EPITHELIAL / LPF: NONE SEEN
Specific Gravity, Urine: 1.004 — ABNORMAL LOW (ref 1.005–1.030)

## 2016-08-15 LAB — COMPREHENSIVE METABOLIC PANEL
ALT: 26 U/L (ref 17–63)
ANION GAP: 8 (ref 5–15)
AST: 27 U/L (ref 15–41)
Albumin: 4.9 g/dL (ref 3.5–5.0)
Alkaline Phosphatase: 80 U/L (ref 38–126)
BUN: 14 mg/dL (ref 6–20)
CHLORIDE: 104 mmol/L (ref 101–111)
CO2: 28 mmol/L (ref 22–32)
CREATININE: 1.53 mg/dL — AB (ref 0.61–1.24)
Calcium: 9.9 mg/dL (ref 8.9–10.3)
GFR, EST AFRICAN AMERICAN: 57 mL/min — AB (ref >60)
GFR, EST NON AFRICAN AMERICAN: 49 mL/min — AB (ref >60)
Glucose, Bld: 96 mg/dL (ref 65–99)
POTASSIUM: 4.1 mmol/L (ref 3.5–5.1)
SODIUM: 140 mmol/L (ref 135–145)
Total Bilirubin: 1.1 mg/dL (ref 0.3–1.2)
Total Protein: 8.4 g/dL — ABNORMAL HIGH (ref 6.5–8.1)

## 2016-08-15 LAB — CBC
HEMATOCRIT: 45.7 % (ref 40.0–52.0)
HEMOGLOBIN: 15.8 g/dL (ref 13.0–18.0)
MCH: 32.3 pg (ref 26.0–34.0)
MCHC: 34.7 g/dL (ref 32.0–36.0)
MCV: 92.9 fL (ref 80.0–100.0)
PLATELETS: 212 10*3/uL (ref 150–440)
RBC: 4.91 MIL/uL (ref 4.40–5.90)
RDW: 13.3 % (ref 11.5–14.5)
WBC: 7.5 10*3/uL (ref 3.8–10.6)

## 2016-08-15 LAB — LIPASE, BLOOD: LIPASE: 25 U/L (ref 11–51)

## 2016-08-15 MED ORDER — SUCRALFATE 1 G PO TABS: 1.0000 g | ORAL_TABLET | Freq: Four times a day (QID) | ORAL | 1 refills | Status: DC

## 2016-08-15 NOTE — ED Provider Notes (Signed)
[facility] Emergency Department Provider Note ____________________________________________   I have reviewed the triage vital signs and the triage nursing note.  HISTORY  Chief Complaint Abdominal Pain   Historian Patient  HPI Linton HamDavid L Ruffolo is a 55 y.o. male who is here stating that he had what he suspected to be a viral intestinal illness a few days ago where he had vomiting, followed by abdominal bloating and abdominal cramping and he is had persistent abdominal discomfort intermittently mostly located to the left lower quadrant. Denies diarrhea. Denies constipation or straining. States that he had a normal dominant this morning. He has not had any black or bloody stools. He had no bloody emesis and the nausea and vomiting seems to have passed. He's not had a fever. He feels bloated. He states actually that the left lower quadrant discomfort has been there really for several months, but given the recent illness he noticed it again. He has had a CT of the abdomen, as well as an MRI ofthe back As well as a colonoscopy all within the past few months.  Pain is mild. Bleeding is moderate. No chest pain or trouble breathing or coughing.    Past Medical History:  Diagnosis Date  . Fatty liver disease, nonalcoholic   . GERD (gastroesophageal reflux disease)   . Hyperlipidemia   . Low back pain   . Sacroiliac pain     Patient Active Problem List   Diagnosis Date Noted  . Encounter for health maintenance examination in adult 01/17/2016  . Gastroesophageal reflux disease without esophagitis 01/17/2016  . Fatty liver disease, nonalcoholic 01/17/2016  . Special screening for malignant neoplasms, colon 01/17/2016  . Gynecomastia 01/17/2016  . Skin lesion 01/17/2016  . Screening for prostate cancer 01/17/2016  . Hydrocele, left 09/08/2015    Past Surgical History:  Procedure Laterality Date  . COLONOSCOPY     never as of 12/2015  . FOOT SURGERY    . JOINT  REPLACEMENT  2007, [id number]   WRIST , KNEE, FOOT  . KNEE SURGERY     bilateral  . WRIST SURGERY      Prior to Admission medications   Medication Sig Start Date End Date Taking? Authorizing Provider  ciprofloxacin (CIPRO) 500 MG tablet Take 1 tablet (500 mg total) by mouth 2 (two) times daily. 07/21/16   Kermit Baloavid S Tysinger, PA-C  cyclobenzaprine (FLEXERIL) 10 MG tablet Take 1 tablet (10 mg total) by mouth 2 (two) times daily as needed for muscle spasms. 05/09/16   Tery SanfilippoMatthew Riester, MD  ibuprofen (ADVIL,MOTRIN) 200 MG tablet Take 400 mg by mouth 3 (three) times daily as needed (pain). Reported on 01/17/2016    Historical Provider, MD  linaclotide Karlene Einstein(LINZESS) 145 MCG CAPS capsule Take 1 capsule (145 mcg total) by mouth daily before breakfast. Patient not taking: Reported on 05/09/2016 02/16/16   Kermit Baloavid S Tysinger, PA-C  ranitidine (ZANTAC) 150 MG tablet Take 150 mg by mouth daily. Reported on 11/04/2015    Historical Provider, MD  sucralfate (CARAFATE) 1 g tablet Take 1 tablet (1 g total) by mouth 4 (four) times daily. 08/15/16 08/15/17  Governor Rooksebecca Raahim Shartzer, MD  tamsulosin (FLOMAX) 0.4 MG CAPS capsule Take 1 capsule (0.4 mg total) by mouth daily. Patient not taking: Reported on 05/09/2016 02/16/16   Kermit Baloavid S Tysinger, PA-C    Allergies  Allergen Reactions  . Codeine Nausea And Vomiting  . Percocet [Oxycodone-Acetaminophen] Nausea Only    Family History  Problem Relation Age of Onset  . Asthma  Mother   . Arthritis Mother   . Diabetes Maternal Aunt     Social History Social History  Substance Use Topics  . Smoking status: Never Smoker  . Smokeless tobacco: Never Used  . Alcohol use Yes     Comment: socially    Review of Systems  Constitutional: Negative for fever. Eyes: Negative for visual changes. ENT: Negative for sore throat. Cardiovascular: Negative for chest pain. Respiratory: Negative for shortness of breath. Gastrointestinal: As per history of present illness. Genitourinary: Negative  for dysuria. Musculoskeletal: Negative for back pain. Skin: Negative for rash. Neurological: Negative for headache. 10 point Review of Systems otherwise negative ____________________________________________   PHYSICAL EXAM:  VITAL SIGNS: ED Triage Vitals [08/15/16 1028]  Enc Vitals Group     BP 132/76     Pulse Rate 62     Resp 18     Temp 97.4 F (36.3 C)     Temp Source Oral     SpO2 99 %     Weight 195 lb (88.5 kg)     Height      Head Circumference      Peak Flow      Pain Score 7     Pain Loc      Pain Edu?      Excl. in GC?      Constitutional: Alert and oriented. Well appearing and in no distress.   HEENT   Head: Normocephalic and atraumatic.      Eyes: Conjunctivae are normal. PERRL. Normal extraocular movements.      Ears:         Nose: No congestion/rhinnorhea.   Mouth/Throat: Mucous membranes are moist.   Neck: No stridor. Cardiovascular/Chest: Normal rate, regular rhythm.  No murmurs, rubs, or gallops. Respiratory: Normal respiratory effort without tachypnea nor retractions. Breath sounds are clear and equal bilaterally. No wheezes/rales/rhonchi. Gastrointestinal: Soft. No distention, no guarding, no rebound. Very mild discomfort in the left lower abdomen. Genitourinary/rectal:Deferred Musculoskeletal: Nontender with normal range of motion in all extremities. No joint effusions.  No lower extremity tenderness.  No edema. Neurologic:  Normal speech and language. No gross or focal neurologic deficits are appreciated. Skin:  Skin is warm, dry and intact. No rash noted. Psychiatric: Mood and affect are normal. Speech and behavior are normal. Patient exhibits appropriate insight and judgment.   ____________________________________________  LABS (pertinent positives/negatives)  Labs Reviewed  COMPREHENSIVE METABOLIC PANEL - Abnormal; Notable for the following:       Result Value   Creatinine, Ser 1.53 (*)    Total Protein 8.4 (*)    GFR calc  non Af Amer 49 (*)    GFR calc Af Amer 57 (*)    All other components within normal limits  URINALYSIS COMPLETEWITH MICROSCOPIC ([facility] ONLY) - Abnormal; Notable for the following:    Color, Urine STRAW (*)    APPearance CLEAR (*)    Specific Gravity, Urine 1.004 (*)    All other components within normal limits  LIPASE, BLOOD  CBC    ____________________________________________    EKG I, [name], MD, the attending physician have personally viewed and interpreted all ECGs.  None ____________________________________________  RADIOLOGY All Xrays were viewed by me. Imaging interpreted by Radiologist.  None __________________________________________  PROCEDURES  Procedure(s) performed: None  Critical Care performed: None  ____________________________________________   ED COURSE / ASSESSMENT AND PLAN  Pertinent labs & imaging results that were available during my care of the patient were reviewed by me and considered  in my medical decision making (see chart for details).   Mr. Hoobler is here for bloating and left lower quadrant discomfort although it sounds like the left lower quadrant discomfort has been there in the past and he's had some level of evaluation for [employer] had symptoms like this.  Does report he's had a negative CT of the abdomen as well as a negative colonoscopy.  He's not reporting fevers, his vitals are stable. His clinical exam is reassuring with just mild discomfort in the left lower quadrant. I discussed with him whether or not to do a CT scan given his exam and laboratory studies are all reassuring at this point time. He is comfortable holding off on this.  I'm uncertain he's having some dietary irritation and suggested possible elimination dietary trial. Also pointed try Carafate. He has nausea medicine at home.    CONSULTATIONS:   None Patient / Family / Caregiver informed of clinical course, medical decision-making process, and agree with  plan.   I discussed return precautions, follow-up instructions, and discharge instructions with patient and/or family.   ___________________________________________   FINAL CLINICAL IMPRESSION(S) / ED DIAGNOSES   Final diagnoses:  Left lower quadrant abdominal pain of unknown etiology  Bloating              Note: This dictation was prepared with Dragon dictation. Any transcriptional errors that result from this process are unintentional    [name], MD 08/15/16 [phone]

## 2016-08-15 NOTE — Discharge Instructions (Signed)
You were evaluated for nausea and vomiting followed by abdominal bloating and some discomfort of the abdomen. Although no certain cause was found, and your exam and evaluation are reassuring in the emergency department today.  Return to the emergency department for any worsening abdominal pain, black or bloody stools, dizziness or passing out, trouble breathing, or any other symptoms concerning to you.  We discussed holding off on CT scan today. Please follow-up with your primary doctor within 1 week. Certainly return to the emergency if you're worsening in any way.  We discussed that you may want to try eliminating dairy and gluten/bread products for about 3 weeks as a trial to see if your digestion improves. You can look up more information about this approach by searching the "Whole 30" diet.

## 2016-08-15 NOTE — ED Triage Notes (Signed)
Pt presents with generalized abd pain for about 9 mths. Has been for same in the past. Last time he came to the ED he said he left due to the wait. NAD, skin warm and dry.

## 2016-08-16 ENCOUNTER — Telehealth: Payer: Self-pay | Admitting: Family Medicine

## 2016-08-16 NOTE — Telephone Encounter (Signed)
Ask pt how he was, that we had recd a notice that he went to ER.  He said he felt so bad that was as far as he could go.  Explained that we can help him with things like that.  And call us anytime.

## 2016-09-13 IMAGING — CT CT ABD-PELV W/ CM
1 of 3 series · 14 of 32 positions shown, 19 images · IV contrast (omnipaque)
Comparison: None.

CLINICAL DATA: Left flank pain, left groin pain for 8 weeks

EXAM:
CT ABDOMEN AND PELVIS WITH CONTRAST
TECHNIQUE: Multidetector CT imaging of the abdomen and pelvis was performed
using the standard protocol following bolus administration of
intravenous contrast.
CONTRAST:  100mL OMNIPAQUE IOHEXOL 350 MG/ML SOLN

[Series 2: routine abd pel with · axial · 0.71mm/px · z∈[-1006,-590]mm · 14 of 93 slices shown, 19 images]
[im 5/93  soft-tissue]
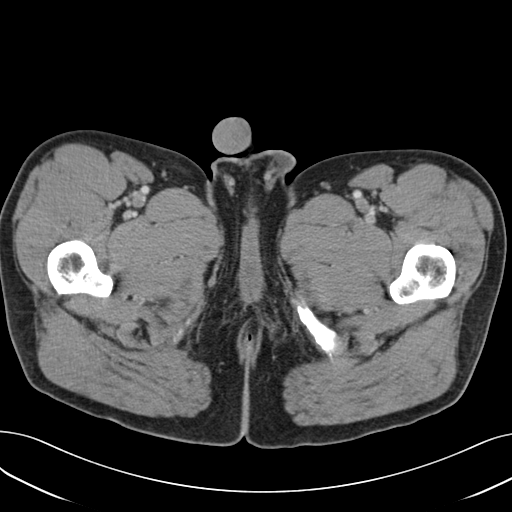
[im 5/93  bone]
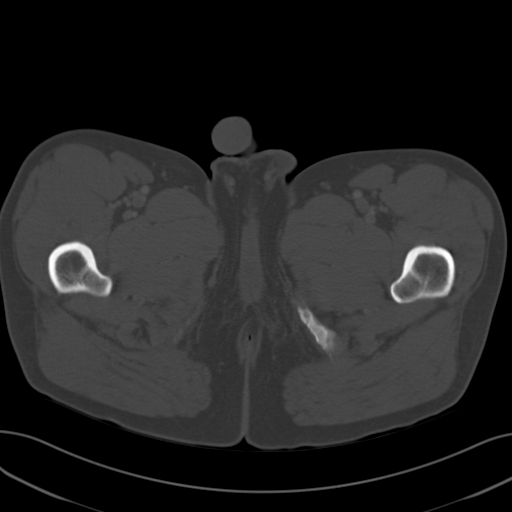
[im 14/93  soft-tissue]
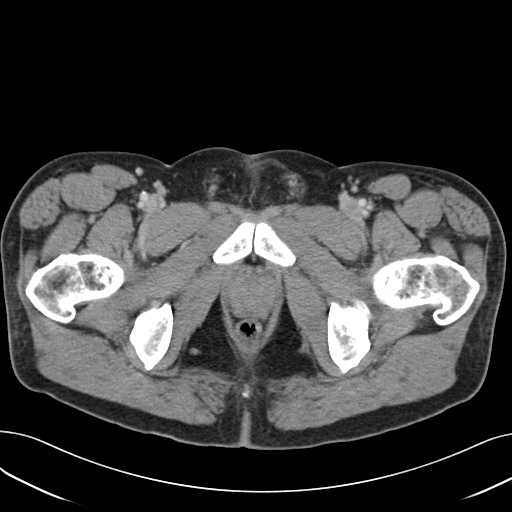
[im 19/93  soft-tissue]
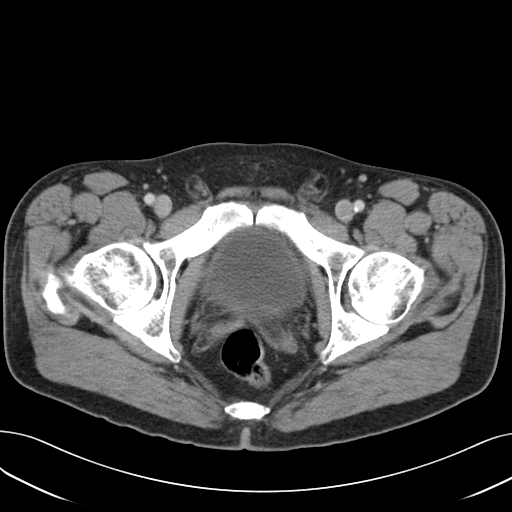
[im 28/93  soft-tissue]
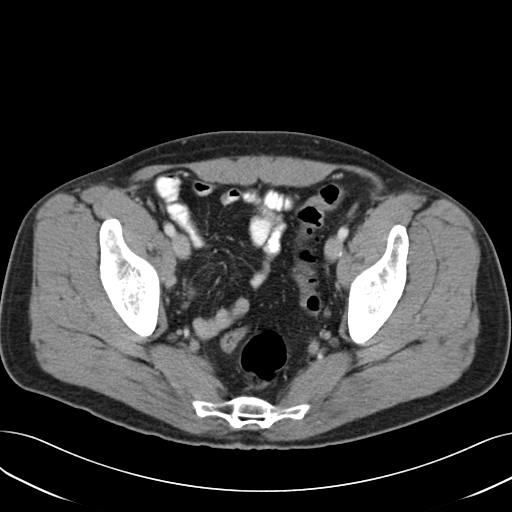
[im 33/93  soft-tissue]
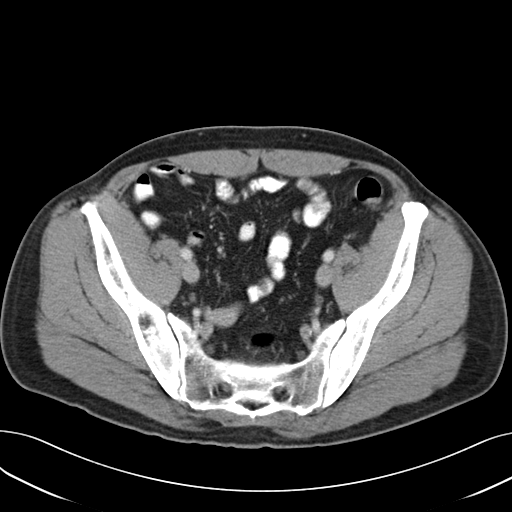
[im 42/93  soft-tissue]
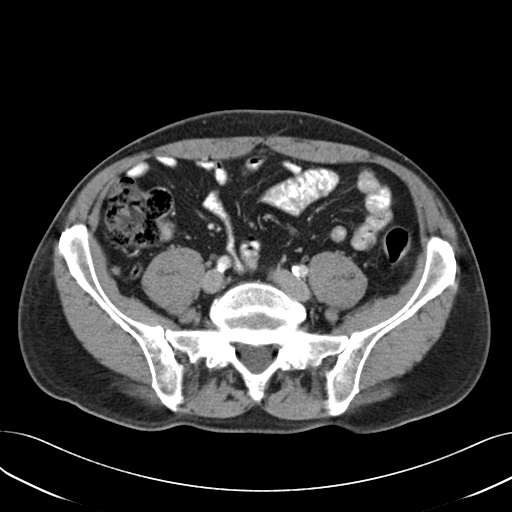
[im 47/93  soft-tissue]
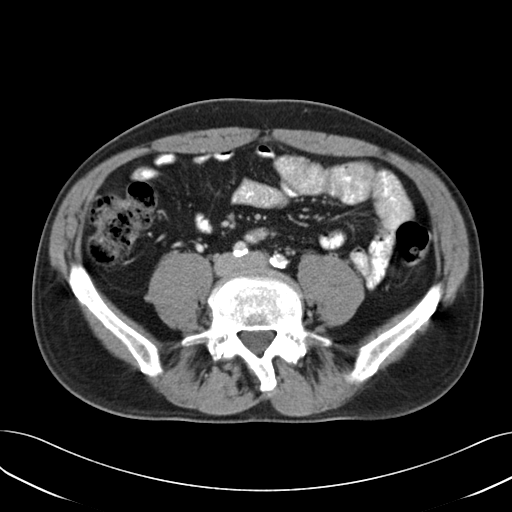
[im 51/93  soft-tissue]
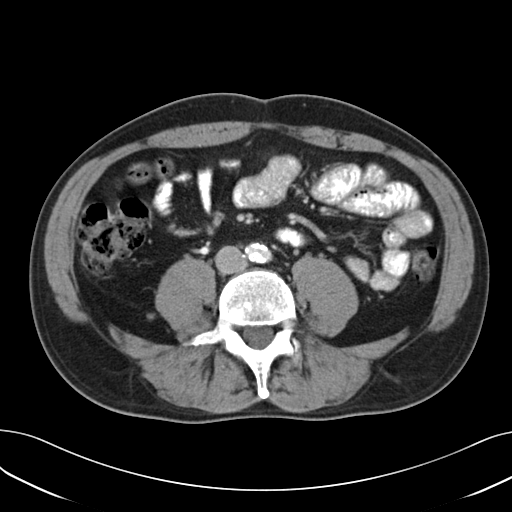
[im 60/93  soft-tissue]
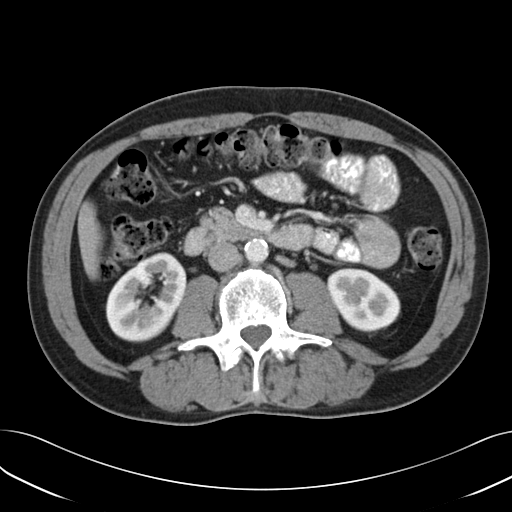
[im 60/93  bone]
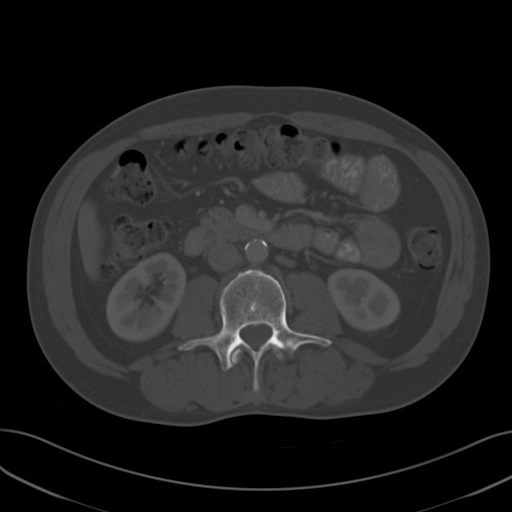
[im 65/93  soft-tissue]
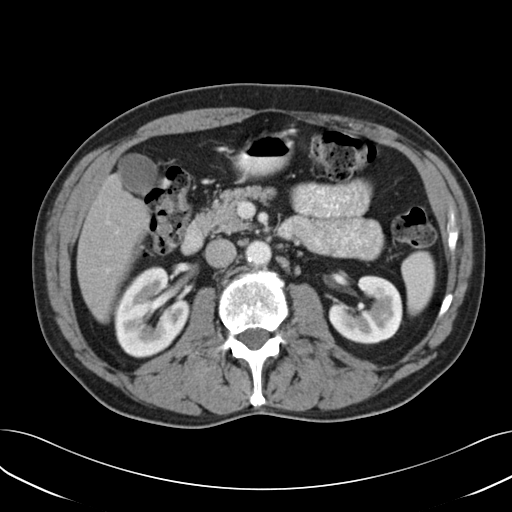
[im 74/93  soft-tissue]
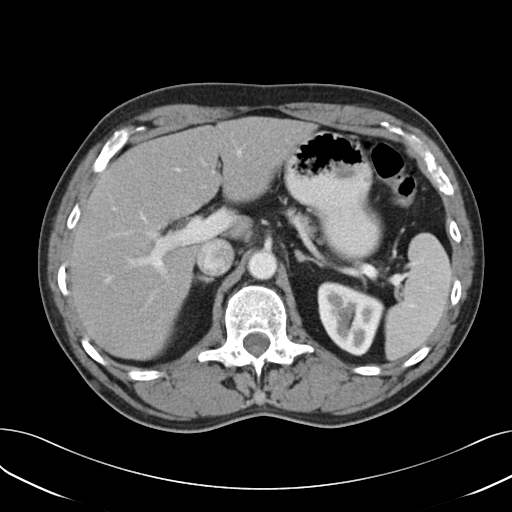
[im 74/93  lung]
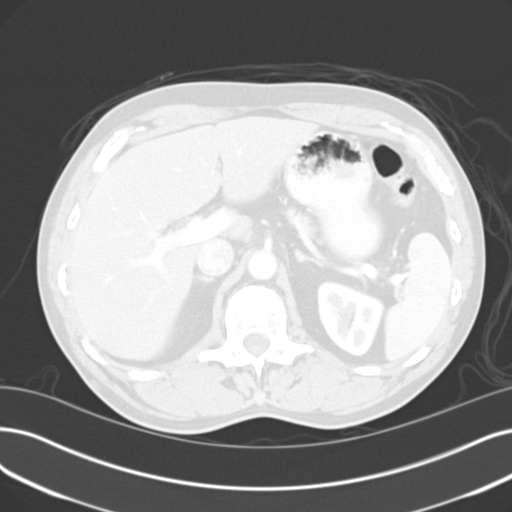
[im 79/93  soft-tissue]
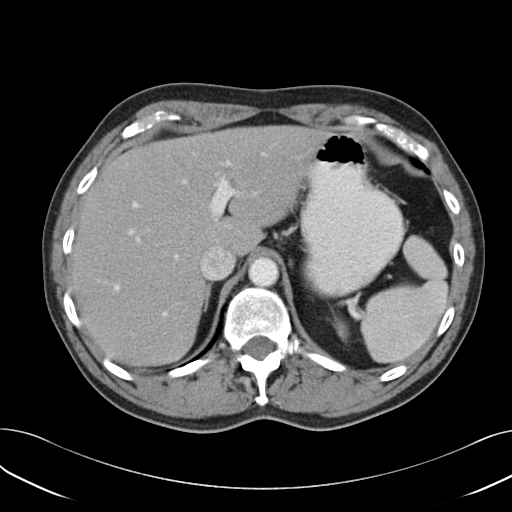
[im 79/93  lung]
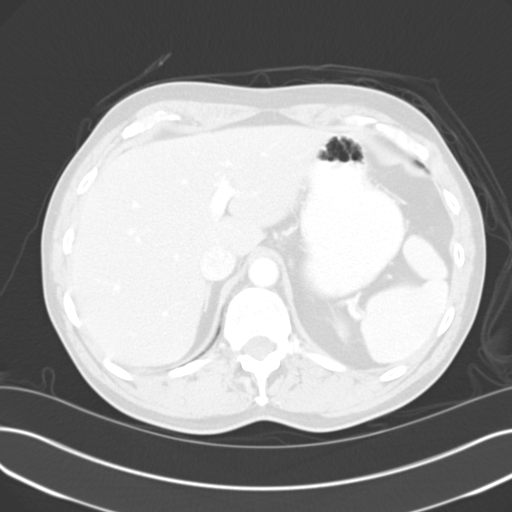
[im 83/93  lung]
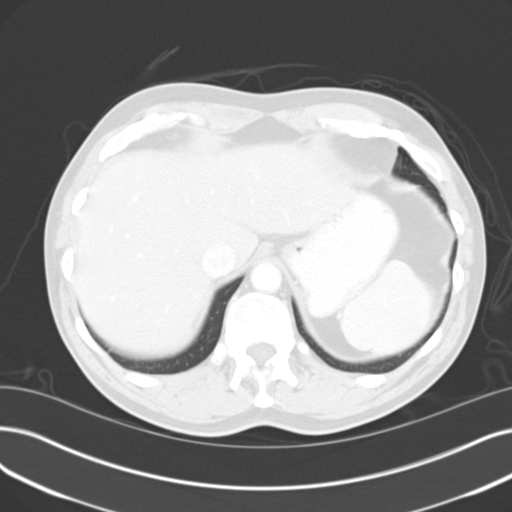
[im 88/93  soft-tissue]
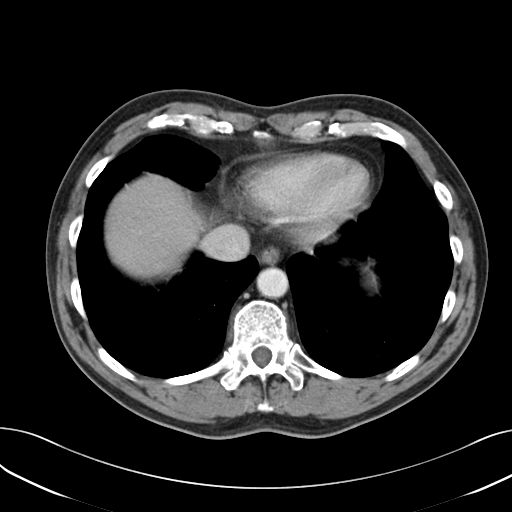
[im 88/93  lung]
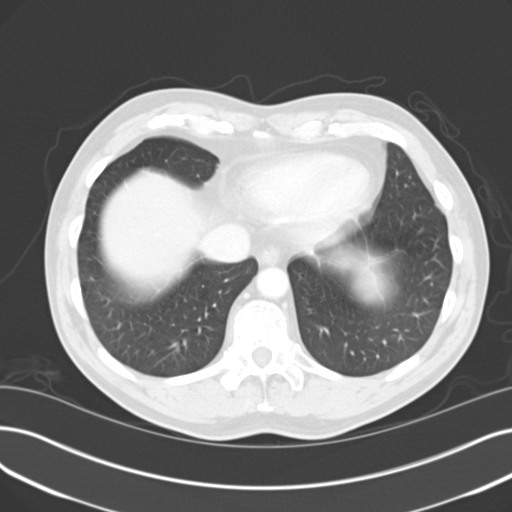

[14 of 32 positions shown; findings below may reference images not displayed]

FINDINGS: The lung bases are unremarkable. Degenerative changes lumbar spine.
There is Schmorl's node deformity upper and lower endplate of L3
vertebral body. Atherosclerotic calcifications of abdominal aorta
and iliac arteries. Mild fatty infiltration of the liver. No aortic
aneurysm. The pancreas, spleen and adrenal glands are unremarkable.

Kidneys are symmetrical in size and enhancement. No hydronephrosis
or hydroureter. No calcified ureteral calculi.

Moderate stool noted in right colon and transverse colon. No
pericecal inflammation. Normal appendix.

There is no colitis or diverticulitis. No distal colonic
obstruction. No small bowel obstruction. No ascites or free air. No
adenopathy. The urinary bladder is unremarkable. Prostate gland and
seminal vesicles are unremarkable. No inguinal adenopathy.
IMPRESSION: 1. There is no acute inflammatory process within abdomen or pelvis.
2. Mild fatty infiltration of the liver.
3. Moderate stool noted in right colon and transverse colon. Normal
appendix. No pericecal inflammation.
4. No small bowel obstruction.
5. No colitis or diverticulitis.

## 2016-09-26 ENCOUNTER — Telehealth: Payer: Self-pay

## 2016-09-26 NOTE — Telephone Encounter (Signed)
I am obviously out of the office til tomorrow, but recommend he can also use My Chart to send me an email too as another form of communication. We can call tomorrow when I am back in the office but send this note back to me.

## 2016-09-26 NOTE — Telephone Encounter (Signed)
He would like to talk to you privately.

## 2016-09-26 NOTE — Telephone Encounter (Signed)
Pt states that he wants to talk to a male, advised OV needed and he said he wanted to talk to you over the phone. He refused to release any information to me and did not want to talk to your CMA.  [phone].

## 2016-09-29 NOTE — Telephone Encounter (Signed)
I called and spoke to patient.  His wife is 30 and he has difficulty.  He has had problems conceiving in the past, but wife really wants a child by him.  He [place] in [place].  I gave some advice and counseling on this.   He will call us back with the office contact info.

## 2016-09-29 NOTE — Telephone Encounter (Signed)
ok 

## 2016-10-20 ENCOUNTER — Telehealth: Payer: Self-pay | Admitting: Medical

## 2016-10-20 NOTE — Telephone Encounter (Signed)
Patient needs referral sent to  [facility] for fertility testing per previous conversation Fax referral to [place] at [phone]

## 2016-10-20 NOTE — Telephone Encounter (Signed)
Faxed referral

## 2016-10-20 NOTE — Telephone Encounter (Signed)
pls send fax referral

## 2016-11-15 ENCOUNTER — Ambulatory Visit: Payer: Self-pay | Admitting: Family Medicine

## 2016-11-17 ENCOUNTER — Ambulatory Visit (INDEPENDENT_AMBULATORY_CARE_PROVIDER_SITE_OTHER): Payer: BLUE CROSS/BLUE SHIELD | Admitting: Family Medicine

## 2016-11-17 ENCOUNTER — Other Ambulatory Visit
Admission: RE | Admit: 2016-11-17 | Discharge: 2016-11-17 | Disposition: A | Discharge status: Home / Self Care | Payer: BLUE CROSS/BLUE SHIELD | Source: Ambulatory Visit | Attending: Family Medicine | Admitting: Family Medicine

## 2016-11-17 ENCOUNTER — Encounter: Payer: Self-pay | Admitting: Family Medicine

## 2016-11-17 VITALS — BP 140/78 | HR 78 | Resp 16 | Ht 72.0 in | Wt 181.0 lb

## 2016-11-17 DIAGNOSIS — H3530 Unspecified macular degeneration: Secondary | ICD-10-CM

## 2016-11-17 DIAGNOSIS — M5136 Other intervertebral disc degeneration, lumbar region: Secondary | ICD-10-CM

## 2016-11-17 DIAGNOSIS — G8929 Other chronic pain: Secondary | ICD-10-CM | POA: Diagnosis not present

## 2016-11-17 DIAGNOSIS — R109 Unspecified abdominal pain: Secondary | ICD-10-CM | POA: Diagnosis not present

## 2016-11-17 DIAGNOSIS — N469 Male infertility, unspecified: Secondary | ICD-10-CM | POA: Insufficient documentation

## 2016-11-17 DIAGNOSIS — M25562 Pain in left knee: Secondary | ICD-10-CM

## 2016-11-17 DIAGNOSIS — R10A0 Flank pain, unspecified side: Secondary | ICD-10-CM

## 2016-11-17 DIAGNOSIS — K219 Gastro-esophageal reflux disease without esophagitis: Secondary | ICD-10-CM

## 2016-11-17 DIAGNOSIS — M51369 Other intervertebral disc degeneration, lumbar region without mention of lumbar back pain or lower extremity pain: Secondary | ICD-10-CM

## 2016-11-17 LAB — POCT URINALYSIS DIPSTICK
Bilirubin, UA: NEGATIVE
Blood, UA: NEGATIVE
Glucose, UA: NEGATIVE
KETONES UA: NEGATIVE
Leukocytes, UA: NEGATIVE
NITRITE UA: NEGATIVE
PROTEIN UA: NEGATIVE
Spec Grav, UA: 1.01
Urobilinogen, UA: 0.2
pH, UA: 7

## 2016-11-17 LAB — BASIC METABOLIC PANEL
ANION GAP: 7 (ref 5–15)
BUN: 8 mg/dL (ref 6–20)
CO2: 29 mmol/L (ref 22–32)
Calcium: 9.4 mg/dL (ref 8.9–10.3)
Chloride: 99 mmol/L — ABNORMAL LOW (ref 101–111)
Creatinine, Ser: 1.06 mg/dL (ref 0.61–1.24)
GFR calc Af Amer: >60 mL/min (ref >60)
Glucose, Bld: 100 mg/dL — ABNORMAL HIGH (ref 65–99)
POTASSIUM: 4 mmol/L (ref 3.5–5.1)
SODIUM: 135 mmol/L (ref 135–145)

## 2016-11-17 MED ORDER — IBUPROFEN 200 MG PO TABS: 800.0000 mg | ORAL_TABLET | Freq: Three times a day (TID) | ORAL | 0 refills | Status: DC | PRN
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
## 2016-12-12 ENCOUNTER — Ambulatory Visit (INDEPENDENT_AMBULATORY_CARE_PROVIDER_SITE_OTHER): Payer: BLUE CROSS/BLUE SHIELD | Admitting: Family Medicine

## 2016-12-12 ENCOUNTER — Encounter: Payer: Self-pay | Admitting: Family Medicine

## 2016-12-12 VITALS — BP 138/78 | HR 81 | Temp 98.7°F | Resp 16 | Ht 72.0 in | Wt 187.0 lb

## 2016-12-12 DIAGNOSIS — R05 Cough: Secondary | ICD-10-CM

## 2016-12-12 DIAGNOSIS — J101 Influenza due to other identified influenza virus with other respiratory manifestations: Secondary | ICD-10-CM | POA: Diagnosis not present

## 2016-12-12 DIAGNOSIS — R059 Cough, unspecified: Secondary | ICD-10-CM

## 2016-12-12 LAB — POCT INFLUENZA A/B
Influenza A, POC: NEGATIVE
Influenza B, POC: POSITIVE — AB

## 2016-12-12 MED ORDER — HYDROCOD POLST-CPM POLST ER 10-8 MG/5ML PO SUER
5.0000 mL | Freq: Two times a day (BID) | ORAL | 0 refills | Status: DC | PRN
Start: 2016-12-12 — End: 2022-08-02

## 2016-12-12 NOTE — Patient Instructions (Signed)

## 2016-12-12 NOTE — Progress Notes (Signed)
Date:  12/12/2016   Name:  [name]   DOB:  03-Oct-1961   MRN:  [id number]  PCP:  Schuyler AmorWilliam Arne Schlender, MD    Chief Complaint: Cough (wheezing and cough since Sat as well as fever, headache, bodyache and weakness. )   History of Present Illness:  This is a 56 y.o. male with 3d hx cough prod white phlegm, sinus congestion, rhinorrhea, HA, LG fever. No known contacts. Cough keeping up at night despite Nyquil/Mucinex.  Review of Systems:  Review of Systems  HENT: Negative for sore throat.   Musculoskeletal: Negative for myalgias.    Patient Active Problem List   Diagnosis Date Noted  . DDD (degenerative disc disease), lumbar 11/17/2016  . Infertility male 11/17/2016  . Chronic pain of left knee 11/17/2016  . Gastroesophageal reflux disease without esophagitis 01/17/2016  . Fatty liver disease, nonalcoholic 01/17/2016  . Gynecomastia 01/17/2016  . Skin lesion 01/17/2016  . Hydrocele, left 09/08/2015  . Age-related macular degeneration 04/15/2015  . Endophthalmitis, left eye 03/02/2015  . Cystoid macular edema 02/09/2012  . Amblyopia 12/28/2011  . 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 Authorizing Provider  esomeprazole (NEXIUM) 20 MG capsule Take 20 mg by mouth daily.   Yes Historical Provider, MD  guaiFENesin (MUCINEX) 600 MG 12 hr tablet Take by mouth 2 (two) times daily.   Yes Historical Provider, MD  ibuprofen (ADVIL) 200 MG tablet Take 4 tablets (800 mg total) by mouth every 8 (eight) hours as needed. 11/17/16  Yes Schuyler AmorWilliam Cynai Skeens, MD  Pseudoeph-Doxylamine-DM-APAP (NYQUIL PO) Take by mouth.   Yes Historical Provider, MD  chlorpheniramine-HYDROcodone (TUSSIONEX PENNKINETIC ER) 10-8 MG/5ML SUER Take 5 mLs by mouth every 12 (twelve) hours as needed for cough. 12/12/16   Schuyler AmorWilliam Duff Pozzi, MD    Allergies  Allergen Reactions  . Codeine Nausea And Vomiting  . Percocet  [Oxycodone-Acetaminophen] Nausea Only    Past Surgical History:  Procedure Laterality Date  . COLONOSCOPY     never as of 12/2015  . FOOT SURGERY    . JOINT REPLACEMENT  2007, [id number]   WRIST , KNEE, FOOT  . KNEE SURGERY     bilateral  . WRIST SURGERY      Social History  Substance Use Topics  . Smoking status: Never Smoker  . Smokeless tobacco: Never Used  . Alcohol use Yes     Comment: socially    Family History  Problem Relation Age of Onset  . Asthma Mother   . Arthritis Mother   . Diabetes Maternal Aunt     Medication list has been reviewed and updated.  Physical Examination: BP 138/78   Pulse 81   Temp 98.7 F (37.1 C)   Resp 16   Ht 6' (1.829 m)   Wt 187 lb (84.8 kg)   SpO2 97%   BMI 25.36 kg/m   Physical Exam  Constitutional: He appears well-developed and well-nourished.  HENT:  Mouth/Throat: Oropharynx is clear and moist.  Eyes: Conjunctivae are normal.  Neck: Neck supple.  Cardiovascular: Normal rate, regular rhythm and normal heart sounds.   Pulmonary/Chest: Effort normal and breath sounds normal. He has no wheezes. He has no rales.  Musculoskeletal: He exhibits no edema.  Lymphadenopathy:    He has no cervical adenopathy.  Neurological: He is alert.  Skin: Skin is warm and dry.  Psychiatric: He has a normal mood and affect. His behavior is normal.  Nursing note and vitals reviewed.   Assessment and Plan:  1. Influenza B Tussionex q12h prn cough only (tolerates despite codeine se's per pt) OOW M-F this week  2. Cough Influenza B positive - POCT Influenza A/B  Return if symptoms worsen or fail to improve.  Dionne Ano. Kingsley Spittle MD [facility]  12/12/2016

## 2016-12-15 ENCOUNTER — Ambulatory Visit: Payer: BLUE CROSS/BLUE SHIELD | Admitting: Family Medicine

## 2016-12-15 ENCOUNTER — Ambulatory Visit: Payer: BLUE CROSS/BLUE SHIELD | Admitting: Internal Medicine

## 2016-12-18 ENCOUNTER — Encounter: Payer: Self-pay | Admitting: Family Medicine

## 2016-12-18 ENCOUNTER — Ambulatory Visit (INDEPENDENT_AMBULATORY_CARE_PROVIDER_SITE_OTHER): Payer: BLUE CROSS/BLUE SHIELD | Admitting: Family Medicine

## 2016-12-18 VITALS — BP 122/82 | HR 78 | Temp 98.4°F | Resp 16 | Ht 73.0 in | Wt 187.0 lb

## 2016-12-18 DIAGNOSIS — G2581 Restless legs syndrome: Secondary | ICD-10-CM

## 2016-12-18 DIAGNOSIS — J101 Influenza due to other identified influenza virus with other respiratory manifestations: Secondary | ICD-10-CM

## 2016-12-18 NOTE — Progress Notes (Signed)
Date:  12/18/2016   Name:  [name]   DOB:  07-11-1961   MRN:  [id number]  PCP:  Schuyler AmorWilliam Jamirah Zelaya, MD    Chief Complaint: Cough (Sinus headache and cough still there also weakness is bad. Pos Flu Tue last week. Needs note that he is ok for work and not contagious. )   History of Present Illness:  This is a 56 y.o. male seen in 6d f/u. Dx'd influenza B last visit. Still coughing with green/yellow phlegm but Tussionex helps. No fever past 3 days, feels he's getting better but still weak and a little woozy. Went to work today and they told him to come back to [place]. Also c/o 2739m hx leg movement at night, better if gets up to walk.  Review of Systems:  Review of Systems  Constitutional: Negative for chills and fever.  HENT: Negative for ear pain and sore throat.   Respiratory: Negative for shortness of breath.   Cardiovascular: Negative for chest pain and leg swelling.  Gastrointestinal: Negative for abdominal pain, constipation and diarrhea.  Genitourinary: Negative for difficulty urinating.  Neurological: Negative for syncope.    Patient Active Problem List   Diagnosis Date Noted  . Restless leg syndrome 12/18/2016  . DDD (degenerative disc disease), lumbar 11/17/2016  . Infertility male 11/17/2016  . Chronic pain of left knee 11/17/2016  . Gastroesophageal reflux disease without esophagitis 01/17/2016  . Fatty liver disease, nonalcoholic 01/17/2016  . Gynecomastia 01/17/2016  . Skin lesion 01/17/2016  . Hydrocele, left 09/08/2015  . Age-related macular degeneration 04/15/2015  . Endophthalmitis, left eye 03/02/2015  . Cystoid macular edema 02/09/2012  . Amblyopia 12/28/2011  . 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 Authorizing Provider  chlorpheniramine-HYDROcodone (TUSSIONEX PENNKINETIC ER) 10-8 MG/5ML SUER Take 5 mLs by mouth every 12 (twelve) hours as needed for cough. 12/12/16   Yes Schuyler AmorWilliam Tamari Busic, MD  esomeprazole (NEXIUM) 20 MG capsule Take 20 mg by mouth daily.   Yes Historical Provider, MD  guaiFENesin (MUCINEX) 600 MG 12 hr tablet Take by mouth 2 (two) times daily.   Yes Historical Provider, MD  ibuprofen (ADVIL) 200 MG tablet Take 4 tablets (800 mg total) by mouth every 8 (eight) hours as needed. 11/17/16  Yes Schuyler AmorWilliam Bastian Andreoli, MD    Allergies  Allergen Reactions  . Codeine Nausea And Vomiting  . Percocet [Oxycodone-Acetaminophen] Nausea Only    Past Surgical History:  Procedure Laterality Date  . COLONOSCOPY     never as of 12/2015  . FOOT SURGERY    . JOINT REPLACEMENT  2007, [id number]   WRIST , KNEE, FOOT  . KNEE SURGERY     bilateral  . WRIST SURGERY      Social History  Substance Use Topics  . Smoking status: Never Smoker  . Smokeless tobacco: Never Used  . Alcohol use Yes     Comment: socially    Family History  Problem Relation Age of Onset  . Asthma Mother   . Arthritis Mother   . Diabetes Maternal Aunt     Medication list has been reviewed and updated.  Physical Examination: BP 122/82   Pulse 78   Temp 98.4 F (36.9 C)   Resp 16   Ht 6\' 1"  (1.854 m)   Wt 187 lb (84.8 kg)   SpO2 97%   BMI 24.67 kg/m   Physical Exam  Constitutional: He appears well-developed and well-nourished.  No distress.  Pulmonary/Chest: Effort normal and breath sounds normal. He has no wheezes. He has no rales.  Musculoskeletal: He exhibits no edema.  Neurological: He is alert.  Skin: Skin is warm and dry. He is not diaphoretic.  Psychiatric: He has a normal mood and affect. His behavior is normal.  Nursing note and vitals reviewed.   Assessment and Plan:  1. Influenza B Resolving but still weak with productive cough, cont Tussionex, fluids, OOW until 12/21/16, note given  2. Restless leg syndrome By hx, instructions given, consider labs/meds if sxs worsen/persist  Return if symptoms worsen or fail to improve.   Needs to follow up with  non-geriatrician  Dionne Ano. Kingsley Spittle MD [facility]  12/18/2016

## 2016-12-18 NOTE — Patient Instructions (Signed)
Restless Legs Syndrome Introduction Restless legs syndrome is a condition that causes uncomfortable feelings or sensations in the legs, especially while sitting or lying down. The sensations usually cause an overwhelming urge to move the legs. The arms can also sometimes be affected. The condition can range from mild to severe. The symptoms often interfere with a person's ability to sleep. What are the causes? The cause of this condition is not known. What increases the risk? This condition is more likely to develop in:  People who are older than age 50.  Pregnant women. In general, restless legs syndrome is more common in women than in men.  People who have a family history of the condition.  People who have certain medical conditions, such as iron deficiency, kidney disease, Parkinson disease, or nerve damage.  People who take certain medicines, such as medicines for high blood pressure, nausea, colds, allergies, depression, and some heart conditions. What are the signs or symptoms? The main symptom of this condition is uncomfortable sensations in the legs. These sensations may be:  Described as pulling, tingling, prickling, throbbing, crawling, or burning.  Worse while you are sitting or lying down.  Worse during periods of rest or inactivity.  Worse at night, often interfering with your sleep.  Accompanied by a very strong urge to move your legs.  Temporarily relieved by movement of your legs. The sensations usually affect both sides of the body. The arms can also be affected, but this is rare. People who have this condition often have tiredness during the day because of their lack of sleep at night. How is this diagnosed? This condition may be diagnosed based on your description of the symptoms. You may also have tests, including blood tests, to check for other conditions that may lead to your symptoms. In some cases, you may be asked to spend some time in a sleep lab so your  sleeping can be monitored. How is this treated? Treatment for this condition is focused on managing the symptoms. Treatment may include:  Self-help and lifestyle changes.  Medicines. Follow these instructions at home:  Take medicines only as directed by your health care provider.  Try these methods to get temporary relief from the uncomfortable sensations:  Massage your legs.  Walk or stretch.  Take a cold or hot bath.  Practice good sleep habits. For example, go to bed and get up at the same time every day.  Exercise regularly.  Practice ways of relaxing, such as yoga or meditation.  Avoid caffeine and alcohol.  Do not use any tobacco products, including cigarettes, chewing tobacco, or electronic cigarettes. If you need help quitting, ask your health care provider.  Keep all follow-up visits as directed by your health care provider. This is important. Contact a health care provider if: Your symptoms do not improve with treatment, or they get worse. This information is not intended to replace advice given to you by your health care provider. Make sure you discuss any questions you have with your health care provider. Document Released: 09/29/2002 Document Revised: 03/16/2016 Document Reviewed: 10/05/2014  2017 Elsevier  

## 2017-03-23 ENCOUNTER — Encounter: Payer: BLUE CROSS/BLUE SHIELD | Admitting: Family Medicine

## 2017-04-06 ENCOUNTER — Encounter: Payer: BLUE CROSS/BLUE SHIELD | Admitting: Family Medicine

## 2018-01-10 ENCOUNTER — Encounter: Payer: Self-pay | Admitting: Emergency Medicine

## 2018-01-10 ENCOUNTER — Emergency Department
Admission: EM | Admit: 2018-01-10 | Discharge: 2018-01-10 | Disposition: A | Discharge status: Home / Self Care | Payer: BLUE CROSS/BLUE SHIELD | Attending: Emergency Medicine | Admitting: Emergency Medicine

## 2018-01-10 ENCOUNTER — Other Ambulatory Visit: Payer: Self-pay

## 2018-01-10 DIAGNOSIS — R112 Nausea with vomiting, unspecified: Secondary | ICD-10-CM | POA: Diagnosis not present

## 2018-01-10 DIAGNOSIS — R197 Diarrhea, unspecified: Secondary | ICD-10-CM | POA: Diagnosis not present

## 2018-01-10 DIAGNOSIS — Z79899 Other long term (current) drug therapy: Secondary | ICD-10-CM | POA: Insufficient documentation

## 2018-01-10 DIAGNOSIS — R1110 Vomiting, unspecified: Secondary | ICD-10-CM | POA: Diagnosis present

## 2018-01-10 LAB — COMPREHENSIVE METABOLIC PANEL
ALT: 22 U/L (ref 17–63)
AST: 26 U/L (ref 15–41)
Albumin: 4 g/dL (ref 3.5–5.0)
Alkaline Phosphatase: 97 U/L (ref 38–126)
Anion gap: 8 (ref 5–15)
BILIRUBIN TOTAL: 1.5 mg/dL — AB (ref 0.3–1.2)
BUN: 14 mg/dL (ref 6–20)
CO2: 23 mmol/L (ref 22–32)
Calcium: 8.2 mg/dL — ABNORMAL LOW (ref 8.9–10.3)
Chloride: 109 mmol/L (ref 101–111)
Creatinine, Ser: 0.8 mg/dL (ref 0.61–1.24)
GFR calc Af Amer: >60 mL/min (ref >60)
GFR calc non Af Amer: >60 mL/min (ref >60)
Glucose, Bld: 130 mg/dL — ABNORMAL HIGH (ref 65–99)
POTASSIUM: 3.4 mmol/L — AB (ref 3.5–5.1)
Sodium: 140 mmol/L (ref 135–145)
TOTAL PROTEIN: 6.7 g/dL (ref 6.5–8.1)

## 2018-01-10 LAB — CBC WITH DIFFERENTIAL/PLATELET
BASOS ABS: 0 10*3/uL (ref 0–0.1)
Basophils Relative: 0 %
Eosinophils Absolute: 0 10*3/uL (ref 0–0.7)
Eosinophils Relative: 0 %
HEMATOCRIT: 41.1 % (ref 40.0–52.0)
HEMOGLOBIN: 14.1 g/dL (ref 13.0–18.0)
LYMPHS PCT: 2 %
Lymphs Abs: 0.2 10*3/uL — ABNORMAL LOW (ref 1.0–3.6)
MCH: 31.4 pg (ref 26.0–34.0)
MCHC: 34.3 g/dL (ref 32.0–36.0)
MCV: 91.5 fL (ref 80.0–100.0)
Monocytes Absolute: 0.5 10*3/uL (ref 0.2–1.0)
Monocytes Relative: 5 %
NEUTROS PCT: 93 %
Neutro Abs: 8.8 10*3/uL — ABNORMAL HIGH (ref 1.4–6.5)
PLATELETS: 175 10*3/uL (ref 150–440)
RBC: 4.49 MIL/uL (ref 4.40–5.90)
RDW: 13.7 % (ref 11.5–14.5)
WBC: 9.6 10*3/uL (ref 3.8–10.6)

## 2018-01-10 LAB — TROPONIN I: Troponin I: <0.03 ng/mL (ref <0.03)

## 2018-01-10 MED ORDER — ONDANSETRON HCL 4 MG/2ML IJ SOLN
4.0000 mg | Freq: Once | INTRAMUSCULAR | Status: AC
  Administered 2018-01-10: 4 mg via INTRAVENOUS
  Filled 2018-01-10: qty 2

## 2018-01-10 MED ORDER — ONDANSETRON HCL 4 MG PO TABS: 4.0000 mg | ORAL_TABLET | Freq: Three times a day (TID) | ORAL | 0 refills | Status: DC | PRN

## 2018-01-10 MED ORDER — SODIUM CHLORIDE 0.9 % IV BOLUS (SEPSIS)
1000.0000 mL | Freq: Once | INTRAVENOUS | Status: AC
  Administered 2018-01-10: 1000 mL via INTRAVENOUS

## 2018-01-10 MED ORDER — LOPERAMIDE HCL 2 MG PO CAPS
4.0000 mg | ORAL_CAPSULE | Freq: Once | ORAL | Status: AC
  Administered 2018-01-10: 4 mg via ORAL
  Filled 2018-01-10: qty 2

## 2018-01-10 NOTE — ED Notes (Signed)
No emesis since being in room.

## 2018-01-10 NOTE — ED Notes (Signed)
First Nurse Note:  Patient to room 3 via WC, Ally RN aware of placement in room.

## 2018-01-10 NOTE — ED Notes (Signed)
Pt alert and oriented X4, active, cooperative, pt in NAD. RR even and unlabored, color WNL.  Pt informed to return if any life threatening symptoms occur.  Discharge and followup instructions reviewed.  

## 2018-01-10 NOTE — ED Notes (Signed)
ED Provider at bedside. 

## 2018-01-10 NOTE — ED Notes (Signed)
Notes from times [id number] entered by [email or web address], Rn were entered in error.

## 2018-01-10 NOTE — Discharge Instructions (Addendum)
You are evaluated for vomiting and diarrhea, and although no certain cause was found, your exam and evaluation are overall reassuring in the emergency room today.  Return to the emergency department immediately for any worsening condition including bloody stool, vomiting blood, fever, trouble breathing or chest pain, concern for dehydration such as not making urine or dry mouth, dizziness or passing out, or any other symptoms concerning to you.

## 2018-01-10 NOTE — ED Notes (Signed)
First Nurse Note:  Patient complaining of N&V and diarrhea starting at approx. 3 AM.  Alert and oriented.  Given emesis bag.

## 2018-01-10 NOTE — ED Triage Notes (Signed)
Previous note entered in error by this RN.  Pt Saltz arrived in triage c/o N,V,D and stomach cramping since 0300 this am, states he is unsure if it is something he ate or a virus. Appears in NAD.

## 2018-01-10 NOTE — ED Notes (Signed)
The EKG was completed and signed by Dr. Lord. The EKG was also exported into the system. 

## 2018-01-10 NOTE — ED Notes (Signed)
Pt states that he feels better, ready to go home. Will notify EDP.

## 2018-01-10 NOTE — ED Provider Notes (Signed)
[facility] Emergency Department Provider Note ____________________________________________   I have reviewed the triage vital signs and the triage nursing note.  HISTORY  Chief Complaint Diarrhea; Abdominal Pain; and Emesis   Historian Patient  HPI [name] is a 57 y.o. male presenting for watery vomiting and diarrhea, numerous episodes of both, nonbloody overnight since around 2 AM.  He feels fatigued.  His abdomen feels crampy all over.  No fever.  No coughing or congestion or trouble breathing or chest pain.  No known sick contacts or known bad food.  Symptoms are moderate to severe.   Past Medical History:  Diagnosis Date  . Fatty liver disease, nonalcoholic   . GERD (gastroesophageal reflux disease)   . Hyperlipidemia   . Low back pain   . Sacroiliac pain     Patient Active Problem List   Diagnosis Date Noted  . Restless leg syndrome 12/18/2016  . DDD (degenerative disc disease), lumbar 11/17/2016  . Infertility male 11/17/2016  . Chronic pain of left knee 11/17/2016  . Gastroesophageal reflux disease without esophagitis 01/17/2016  . Fatty liver disease, nonalcoholic 01/17/2016  . Gynecomastia 01/17/2016  . Skin lesion 01/17/2016  . Hydrocele, left 09/08/2015  . Age-related macular degeneration 04/15/2015  . Endophthalmitis, left eye 03/02/2015  . Cystoid macular edema 02/09/2012  . Amblyopia 12/28/2011  . 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 COLONOSCOPY     never as of 12/2015  . FOOT SURGERY    . JOINT REPLACEMENT  2007, [id number]   WRIST , KNEE, FOOT  . KNEE SURGERY     bilateral  . WRIST SURGERY      Prior to Admission medications   Medication Sig Start Date End Date Taking? Authorizing Provider  atorvastatin (LIPITOR) 40 MG tablet Take 1 tablet by mouth daily. 01/07/18  Yes [provider]  esomeprazole (NEXIUM) 20 MG capsule Take 20  mg by mouth daily.   Yes [provider]  chlorpheniramine-HYDROcodone (TUSSIONEX PENNKINETIC ER) 10-8 MG/5ML SUER Take 5 mLs by mouth every 12 (twelve) hours as needed for cough. Patient not taking: Reported on 01/10/2018 12/12/16   [name], MD  ibuprofen (ADVIL) 200 MG tablet Take 4 tablets (800 mg total) by mouth every 8 (eight) hours as needed. Patient not taking: Reported on 01/10/2018 11/17/16   [name], MD  ondansetron (ZOFRAN) 4 MG tablet Take 1 tablet (4 mg total) by mouth every 8 (eight) hours as needed for nausea or vomiting. 01/10/18   [name], MD    Allergies  Allergen Reactions  . Codeine Nausea And Vomiting  . Percocet [Oxycodone-Acetaminophen] Nausea Only    Family History  Problem Relation Age of Onset  . Asthma Mother   . Arthritis Mother   . Diabetes Maternal Aunt     Social History Social History   Tobacco Use  . Smoking status: Never Smoker  . Smokeless tobacco: Never Used  Substance Use Topics  . Alcohol use: Yes    Comment: socially  . Drug use: No    Review of Systems  Constitutional: Negative for fever. Eyes: Negative for visual changes. ENT: Negative for sore throat. Cardiovascular: Negative for chest pain. Respiratory: Negative for shortness of breath. Gastrointestinal: Positive as per HPI Genitourinary: Negative for dysuria. Musculoskeletal: Negative for back pain. Skin: Negative for rash. Neurological: Negative for headache.  ____________________________________________   PHYSICAL EXAM:  VITAL SIGNS: ED Triage Vitals  Enc Vitals  Group     BP 01/10/18 0746 125/70     Pulse Rate 01/10/18 0743 78     Resp 01/10/18 0746 20     Temp 01/10/18 0743 98 F (36.7 C)     Temp Source 01/10/18 0743 Oral     SpO2 01/10/18 0746 98 %     Weight 01/10/18 0746 183 lb (83 kg)     Height 01/10/18 0746 6\' 1"  (1.854 m)     Head Circumference --      Peak Flow --      Pain Score 01/10/18 0746 8     Pain Loc --      Pain  Edu? --      Excl. in GC? --      Constitutional: Alert and oriented. Well appearing and in no distress. HEENT   Head: Normocephalic and atraumatic.      Eyes: Conjunctivae are normal. Pupils equal and round.       Ears:         Nose: No congestion/rhinnorhea.   Mouth/Throat: Mucous membranes are mildly dry.   Neck: No stridor. Cardiovascular/Chest: Normal rate, regular rhythm.  No murmurs, rubs, or gallops. Respiratory: Normal respiratory effort without tachypnea nor retractions. Breath sounds are clear and equal bilaterally. No wheezes/rales/rhonchi. Gastrointestinal: Soft. No distention, no guarding, no rebound.  Mild discomfort diffusely without any focal McBurney's point tenderness or focal right upper quadrant tenderness. Genitourinary/rectal:Deferred Musculoskeletal: Nontender with normal range of motion in all extremities. No joint effusions.  No lower extremity tenderness.  No edema. Neurologic:  Normal speech and language. No gross or focal neurologic deficits are appreciated. Skin:  Skin is warm, dry and intact. No rash noted. Psychiatric: Mood and affect are normal. Speech and behavior are normal. Patient exhibits appropriate insight and judgment.   ____________________________________________  LABS (pertinent positives/negatives) I, [name], MD the attending physician have reviewed the labs noted below.  Labs Reviewed  COMPREHENSIVE METABOLIC PANEL - Abnormal; Notable for the following components:      Result Value   Potassium 3.4 (*)    Glucose, Bld 130 (*)    Calcium 8.2 (*)    Total Bilirubin 1.5 (*)    All other components within normal limits  CBC WITH DIFFERENTIAL/PLATELET - Abnormal; Notable for the following components:   Neutro Abs 8.8 (*)    Lymphs Abs 0.2 (*)    All other components within normal limits  TROPONIN I    ____________________________________________    EKG I, [name], MD, the attending physician have personally  viewed and interpreted all ECGs.  86 bpm.  Normal sinus rhythm.  Normal axis.  Incomplete right bundle branch block.  Nonspecific ST and T wave ____________________________________________  RADIOLOGY   None __________________________________________  PROCEDURES  Procedure(s) performed: None  Procedures  Critical Care performed: None   ____________________________________________  ED COURSE / ASSESSMENT AND PLAN  Pertinent labs & imaging results that were available during my care of the patient were reviewed by me and considered in my medical decision making (see chart for details).   Patient looks a little clinically dehydrated we will start fluids and nausea medication for clinical symptoms as well as Imodium.  Not suspicious of intra-abdominal emergency at this point time such as obstruction or diverticulitis.  I am most suspicious for viral gastritis, as this is extremely common in the community right now.  No abnormal vital signs.  Initial EKG is reassuring.  I do not think that this is nausea as  an anginal equivalent.   Laboratory values are also reassuring.  No elevated white blood cell count.  Just barely low potassium 3.4.  Heme globin 14.  Troponin is reassuring.  Patient feeling okay after hydration and nausea medication.  I will discharge home with nausea medication.    CONSULTATIONS:  None  Patient / Family / Caregiver informed of clinical course, medical decision-making process, and agree with plan.   I discussed return precautions, follow-up instructions, and discharge instructions with patient and/or family.  Discharge Instructions : You are evaluated for vomiting and diarrhea, and although no certain cause was found, your exam and evaluation are overall reassuring in the emergency room today.  Return to the emergency department immediately for any worsening condition including bloody stool, vomiting blood, fever, trouble breathing or chest  pain, concern for dehydration such as not making urine or dry mouth, dizziness or passing out, or any other symptoms concerning to you.    ___________________________________________   FINAL CLINICAL IMPRESSION(S) / ED DIAGNOSES   Final diagnoses:  Nausea vomiting and diarrhea      ___________________________________________        Note: This dictation was prepared with Dragon dictation. Any transcriptional errors that result from this process are unintentional    Governor RooksLord, Peng Thorstenson, MD 01/10/18 1219

## 2020-05-11 ENCOUNTER — Other Ambulatory Visit: Payer: Self-pay

## 2020-05-11 ENCOUNTER — Emergency Department
Admission: EM | Admit: 2020-05-11 | Discharge: 2020-05-11 | Disposition: A | Discharge status: Home / Self Care | Payer: Self-pay | Attending: Emergency Medicine | Admitting: Emergency Medicine

## 2020-05-11 DIAGNOSIS — Z5321 Procedure and treatment not carried out due to patient leaving prior to being seen by health care provider: Secondary | ICD-10-CM | POA: Insufficient documentation

## 2020-05-11 LAB — CBC
HCT: 37.9 % — ABNORMAL LOW (ref 39.0–52.0)
Hemoglobin: 13.2 g/dL (ref 13.0–17.0)
MCH: 32 pg (ref 26.0–34.0)
MCHC: 34.8 g/dL (ref 30.0–36.0)
MCV: 91.8 fL (ref 80.0–100.0)
Platelets: 200 10*3/uL (ref 150–400)
RBC: 4.13 MIL/uL — ABNORMAL LOW (ref 4.22–5.81)
RDW: 13.5 % (ref 11.5–15.5)
WBC: 21.3 10*3/uL — ABNORMAL HIGH (ref 4.0–10.5)
nRBC: 0 % (ref 0.0–0.2)

## 2020-05-11 LAB — BASIC METABOLIC PANEL
Anion gap: 10 (ref 5–15)
BUN: 9 mg/dL (ref 6–20)
CO2: 25 mmol/L (ref 22–32)
Calcium: 9.4 mg/dL (ref 8.9–10.3)
Chloride: 104 mmol/L (ref 98–111)
Creatinine, Ser: 0.98 mg/dL (ref 0.61–1.24)
GFR calc Af Amer: >60 mL/min (ref >60)
GFR calc non Af Amer: >60 mL/min (ref >60)
Glucose, Bld: 120 mg/dL — ABNORMAL HIGH (ref 70–99)
Potassium: 3.5 mmol/L (ref 3.5–5.1)
Sodium: 139 mmol/L (ref 135–145)

## 2020-05-11 LAB — ACETAMINOPHEN LEVEL: Acetaminophen (Tylenol), Serum: <10 ug/mL — ABNORMAL LOW (ref 10–30)

## 2020-05-11 NOTE — ED Triage Notes (Signed)
Pt comes via POV from home with c/o accidental overdose. Pt states he thought he was taking his tapered presidone but instead took 5 -50mg  of tramadol.  Pt states nausea.

## 2020-08-11 ENCOUNTER — Other Ambulatory Visit: Payer: Self-pay

## 2020-08-11 ENCOUNTER — Encounter: Payer: Self-pay | Admitting: Emergency Medicine

## 2020-08-11 ENCOUNTER — Emergency Department
Admission: EM | Admit: 2020-08-11 | Discharge: 2020-08-11 | Disposition: A | Discharge status: Home / Self Care | Payer: BC Managed Care – PPO | Attending: Emergency Medicine | Admitting: Emergency Medicine

## 2020-08-11 DIAGNOSIS — S60361A Insect bite (nonvenomous) of right thumb, initial encounter: Secondary | ICD-10-CM | POA: Insufficient documentation

## 2020-08-11 DIAGNOSIS — W57XXXA Bitten or stung by nonvenomous insect and other nonvenomous arthropods, initial encounter: Secondary | ICD-10-CM | POA: Insufficient documentation

## 2020-08-11 MED ORDER — PREDNISONE 20 MG PO TABS
60.0000 mg | ORAL_TABLET | Freq: Once | ORAL | Status: AC
  Administered 2020-08-11: 60 mg via ORAL
  Filled 2020-08-11: qty 3

## 2020-08-11 MED ORDER — HYDROXYZINE HCL 50 MG PO TABS: 50.0000 mg | ORAL_TABLET | Freq: Three times a day (TID) | ORAL | 0 refills | Status: DC | PRN

## 2020-08-11 MED ORDER — HYDROXYZINE HCL 50 MG PO TABS
50.0000 mg | ORAL_TABLET | Freq: Once | ORAL | Status: AC
  Administered 2020-08-11: 50 mg via ORAL
  Filled 2020-08-11: qty 1

## 2020-08-11 MED ORDER — METHYLPREDNISOLONE 4 MG PO TBPK: ORAL_TABLET | ORAL | 0 refills | Status: DC

## 2020-08-11 NOTE — ED Provider Notes (Signed)
[facility] Emergency Department Provider Note   ____________________________________________   First MD Initiated Contact with Patient 08/11/20 1138     (approximate)  I have reviewed the triage vital signs and the nursing notes.   HISTORY  Chief Complaint Insect Bite and Hand Pain    HPI [name] is a 59 y.o. male patient complains of swelling and itching to the right thumb.  Patient believes he feels bitten by insects earlier today while wearing gloves.  Patient denies loss of function or loss of sensation.  Patient rates pain as a 4/10.  Describes the pain as "achy/itching.  No palliative measure for complaint.         Past Medical History:  Diagnosis Date  . Fatty liver disease, nonalcoholic   . GERD (gastroesophageal reflux disease)   . Hyperlipidemia   . Low back pain   . Sacroiliac pain     Patient Active Problem List   Diagnosis Date Noted  . Restless leg syndrome 12/18/2016  . DDD (degenerative disc disease), lumbar 11/17/2016  . Infertility male 11/17/2016  . Chronic pain of left knee 11/17/2016  . Gastroesophageal reflux disease without esophagitis 01/17/2016  . Fatty liver disease, nonalcoholic 01/17/2016  . Gynecomastia 01/17/2016  . Skin lesion 01/17/2016  . Hydrocele, left 09/08/2015  . Age-related macular degeneration 04/15/2015  . Endophthalmitis, left eye 03/02/2015  . Cystoid macular edema 02/09/2012  . Amblyopia 12/28/2011  . 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 COLONOSCOPY     never as of 12/2015  . FOOT SURGERY    . JOINT REPLACEMENT  2007, [id number]   WRIST , KNEE, FOOT  . KNEE SURGERY     bilateral  . WRIST SURGERY      Prior to Admission medications   Medication Sig Start Date End Date Taking? Authorizing Provider  atorvastatin (LIPITOR) 40 MG tablet Take 1 tablet by mouth daily. 01/07/18   [provider]    chlorpheniramine-HYDROcodone (TUSSIONEX PENNKINETIC ER) 10-8 MG/5ML SUER Take 5 mLs by mouth every 12 (twelve) hours as needed for cough. Patient not taking: Reported on 01/10/2018 12/12/16   [name], MD  esomeprazole (NEXIUM) 20 MG capsule Take 20 mg by mouth daily.    [provider]  hydrOXYzine (ATARAX/VISTARIL) 50 MG tablet Take 1 tablet (50 mg total) by mouth 3 (three) times daily as needed for itching. 08/11/20   Joni Reining, PA-C  ibuprofen (ADVIL) 200 MG tablet Take 4 tablets (800 mg total) by mouth every 8 (eight) hours as needed. Patient not taking: Reported on 01/10/2018 11/17/16   [name], MD  methylPREDNISolone (MEDROL DOSEPAK) 4 MG TBPK tablet Take Tapered dose as directed 08/11/20   Joni Reining, PA-C  ondansetron (ZOFRAN) 4 MG tablet Take 1 tablet (4 mg total) by mouth every 8 (eight) hours as needed for nausea or vomiting. 01/10/18   [name], MD    Allergies Codeine and Percocet [oxycodone-acetaminophen]  Family History  Problem Relation Age of Onset  . Asthma Mother   . Arthritis Mother   . Diabetes Maternal Aunt     Social History Social History   Tobacco Use  . Smoking status: Never Smoker  . Smokeless tobacco: Never Used  Substance Use Topics  . Alcohol use: Yes    Comment: socially  . Drug use: No    Review of Systems Constitutional: No fever/chills Eyes: No visual changes. ENT: No  sore throat. Cardiovascular: Denies chest pain. Respiratory: Denies shortness of breath. Gastrointestinal: No abdominal pain.  No nausea, no vomiting.  No diarrhea.  No constipation. Genitourinary: Negative for dysuria. Musculoskeletal: Negative for back pain. Skin: Negative for rash.  Redness and swelling of left thumb. Neurological: Negative for headaches, focal weakness or numbness. Endocrine:  Hyperlipidemia. Allergic/Immunilogical: Codeine and Percocets.  ____________________________________________   PHYSICAL EXAM:  VITAL  SIGNS: ED Triage Vitals  Enc Vitals Group     BP 08/11/20 1050 (!) 152/72     Pulse Rate 08/11/20 1050 77     Resp 08/11/20 1050 18     Temp 08/11/20 1050 97.9 F (36.6 C)     Temp Source 08/11/20 1050 Oral     SpO2 08/11/20 1050 97 %     Weight 08/11/20 1048 174 lb (78.9 kg)     Height 08/11/20 1048 6\' 1"  (1.854 m)     Head Circumference --      Peak Flow --      Pain Score 08/11/20 1048 4     Pain Loc --      Pain Edu? --      Excl. in GC? --     Constitutional: Alert and oriented. Well appearing and in no acute distress. Cardiovascular: Normal rate, regular rhythm. Grossly normal heart sounds.  Good peripheral circulation.  Elevated blood pressure. Respiratory: Normal respiratory effort.  No retractions. Lungs CTAB. Neurologic:  Normal speech and language. No gross focal neurologic deficits are appreciated. No gait instability. Skin: Edema dorsal aspect of right thumb.  Psychiatric: Mood and affect are normal. Speech and behavior are normal.  ____________________________________________   LABS (all labs ordered are listed, but only abnormal results are displayed)  Labs Reviewed - No data to display ____________________________________________  EKG   ____________________________________________  RADIOLOGY I, 08/13/20, personally viewed and evaluated these images (plain radiographs) as part of my medical decision making, as well as reviewing the written report by the radiologist.  ED MD interpretation:    Official radiology report(s): No results found.  ____________________________________________   PROCEDURES  Procedure(s) performed (including Critical Care):  Procedures   ____________________________________________   INITIAL IMPRESSION / ASSESSMENT AND PLAN / ED COURSE  As part of my medical decision making, I reviewed the following data within the electronic MEDICAL RECORD NUMBER     Patient presents with edema and erythema to the right times  secondary to insect bite.  No signs of signs of secondary infection.  Patient given discharge care instructions.  Patient advised take medication as directed.  Advised follow-up PCP.          ____________________________________________   FINAL CLINICAL IMPRESSION(S) / ED DIAGNOSES  Final diagnoses:  Insect bite of right thumb, initial encounter     ED Discharge Orders         Ordered    methylPREDNISolone (MEDROL DOSEPAK) 4 MG TBPK tablet        08/11/20 1148    hydrOXYzine (ATARAX/VISTARIL) 50 MG tablet  3 times daily PRN        08/11/20 1148          *Please note:  [name] was evaluated in Emergency Department on 08/11/2020 for the symptoms described in the history of present illness. He was evaluated in the context of the global COVID-19 pandemic, which necessitated consideration that the patient might be at risk for infection with the SARS-CoV-2 virus that causes COVID-19. Institutional protocols and algorithms that pertain to the  evaluation of patients at risk for COVID-19 are in a state of rapid change based on information released by regulatory bodies including the CDC and federal and state organizations. These policies and algorithms were followed during the patient's care in the ED.  Some ED evaluations and interventions may be delayed as a result of limited staffing during and the pandemic.*   Note:  This document was prepared using Dragon voice recognition software and may include unintentional dictation errors.    Joni Reining, PA-C 08/11/20 1153    Merwyn Katos, MD 08/11/20 [phone]

## 2020-08-11 NOTE — ED Notes (Signed)
See triage note. Pt ambulatory to room. Pt stating he believes he was bitten by a spider. Pt with small puncture to right index finger. Index finger swollen, red and migrates to wrist.

## 2020-08-11 NOTE — Discharge Instructions (Addendum)
Follow discharge care instruction take medication as directed. °

## 2020-08-11 NOTE — ED Triage Notes (Signed)
Pt states that he thinks he got bit by something because this am he started with swelling and itching to right hand thumb.

## 2021-03-25 ENCOUNTER — Other Ambulatory Visit: Payer: Self-pay

## 2021-07-05 ENCOUNTER — Other Ambulatory Visit: Payer: Self-pay

## 2021-07-05 ENCOUNTER — Encounter: Payer: Self-pay | Admitting: *Deleted

## 2021-07-05 DIAGNOSIS — W57XXXA Bitten or stung by nonvenomous insect and other nonvenomous arthropods, initial encounter: Secondary | ICD-10-CM | POA: Insufficient documentation

## 2021-07-05 DIAGNOSIS — S60562A Insect bite (nonvenomous) of left hand, initial encounter: Secondary | ICD-10-CM | POA: Diagnosis not present

## 2021-07-05 DIAGNOSIS — Z5321 Procedure and treatment not carried out due to patient leaving prior to being seen by health care provider: Secondary | ICD-10-CM | POA: Insufficient documentation

## 2021-07-05 NOTE — ED Triage Notes (Signed)
Pt was cleaning out a drain and when he stuck his hands in the drain there were fire ants. C/o swelling in the left hand. Has not taken any medication for the same.

## 2021-07-06 ENCOUNTER — Emergency Department
Admission: EM | Admit: 2021-07-06 | Discharge: 2021-07-06 | Disposition: A | Discharge status: Home / Self Care | Payer: BC Managed Care – PPO | Attending: Emergency Medicine | Admitting: Emergency Medicine

## 2021-07-06 MED ORDER — DIPHENHYDRAMINE HCL 25 MG PO CAPS
25.0000 mg | ORAL_CAPSULE | Freq: Once | ORAL | Status: DC
  Filled 2021-07-06: qty 1

## 2021-07-06 NOTE — ED Notes (Signed)
Called pt in lobby to admin benadryl. No response

## 2021-07-06 NOTE — ED Notes (Signed)
No answer when called several times from lobby 

## 2021-12-13 ENCOUNTER — Ambulatory Visit
Admission: RE | Admit: 2021-12-13 | Discharge: 2021-12-13 | Disposition: A | Discharge status: Home / Self Care | Payer: BC Managed Care – PPO | Attending: Family Medicine | Admitting: Family Medicine

## 2021-12-13 ENCOUNTER — Ambulatory Visit
Admission: RE | Admit: 2021-12-13 | Discharge: 2021-12-13 | Disposition: A | Discharge status: Home / Self Care | Payer: BC Managed Care – PPO | Source: Ambulatory Visit | Attending: Family Medicine | Admitting: Family Medicine

## 2021-12-13 ENCOUNTER — Other Ambulatory Visit: Payer: Self-pay

## 2021-12-13 ENCOUNTER — Other Ambulatory Visit: Payer: Self-pay | Admitting: Family Medicine

## 2021-12-13 DIAGNOSIS — M545 Low back pain, unspecified: Secondary | ICD-10-CM | POA: Insufficient documentation

## 2021-12-13 DIAGNOSIS — G8929 Other chronic pain: Secondary | ICD-10-CM | POA: Insufficient documentation

## 2022-07-10 ENCOUNTER — Telehealth: Payer: Self-pay | Admitting: Orthopaedic Surgery

## 2022-07-10 NOTE — Telephone Encounter (Signed)
Patient was seen at another orthopedic office and was told that he will need a knee replacement. He has some questions about possibly scheduling a surgery after his appointment with Dr. Ninfa Linden on 10/2 in regards to billing, etc... CB # [phone]

## 2022-07-10 NOTE — Telephone Encounter (Signed)
Spoke with patient and answered questions.

## 2022-07-19 ENCOUNTER — Ambulatory Visit (INDEPENDENT_AMBULATORY_CARE_PROVIDER_SITE_OTHER): Payer: BC Managed Care – PPO | Admitting: Orthopaedic Surgery

## 2022-07-19 ENCOUNTER — Ambulatory Visit (INDEPENDENT_AMBULATORY_CARE_PROVIDER_SITE_OTHER): Payer: BC Managed Care – PPO

## 2022-07-19 ENCOUNTER — Encounter: Payer: Self-pay | Admitting: Orthopaedic Surgery

## 2022-07-19 VITALS — Ht 71.5 in | Wt 182.6 lb

## 2022-07-19 DIAGNOSIS — G8929 Other chronic pain: Secondary | ICD-10-CM | POA: Diagnosis not present

## 2022-07-19 DIAGNOSIS — M25562 Pain in left knee: Secondary | ICD-10-CM

## 2022-07-19 DIAGNOSIS — M1712 Unilateral primary osteoarthritis, left knee: Secondary | ICD-10-CM

## 2022-07-19 NOTE — Progress Notes (Signed)
The patient is a very pleasant 61 year old gentleman has been referred to me by one of my other patients.  He has been seen elsewhere for his left knee and has been told he needs a knee replacement.  He has a remote history of an ACL reconstruction performed over 30 years ago for his left knee.  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  He is very active.  He is not a diabetic and does not have significant active medical issues.  His left knee does hurt on a daily basis.  Has been locking catching.  He has been limping due to the pain in that knee and at this point his left knee pain is detrimentally affecting his mobility, his quality of life and his actives daily living to the point he does wish to proceed with a knee replacement in the near future.  He has tried and failed all forms conservative treatment for over a year now and his pain is getting significantly worse.  X-rays of his left knee are reviewed with him.  He has bone-on-bone wear of all 3 compartments of the knee with osteophytes in all 3 compartments.  There is retained interference screws from a previous ACL reconstruction.  On exam he fortunately has good range of motion of that left knee.  There is pain throughout the arc of motion.  There is patellofemoral crepitation as well.  At this point I agree that he does need knee replacement surgery.  I showed him a knee replacement model and described in detail what the surgery involves.  We talked about the risks and benefits of surgery and what to expect from an intraoperative and postoperative course.  All question concerns were answered addressed.  We will work on getting this scheduled hopefully soon.  We will then see him back in 2 weeks postoperative.

## 2022-07-24 ENCOUNTER — Ambulatory Visit: Payer: BC Managed Care – PPO | Admitting: Orthopaedic Surgery

## 2022-07-26 ENCOUNTER — Telehealth: Payer: Self-pay | Admitting: Orthopaedic Surgery

## 2022-07-26 NOTE — Telephone Encounter (Signed)
Pt called requesting a call from Dr Ninfa Linden Pt states he has questions about his upcoming surgery. Please call pt at [phone]

## 2022-07-26 NOTE — Telephone Encounter (Signed)
Please advise 

## 2022-08-07 NOTE — Progress Notes (Signed)
Pt. Needs orders for surgery. 

## 2022-08-07 NOTE — Patient Instructions (Signed)
DUE TO COVID-19 ONLY TWO VISITORS  (aged 63 and older)  ARE ALLOWED TO COME WITH YOU AND STAY IN THE WAITING ROOM ONLY DURING PRE OP AND PROCEDURE.   **NO VISITORS ARE ALLOWED IN THE SHORT STAY AREA OR RECOVERY ROOM!!**  IF YOU WILL BE ADMITTED INTO THE HOSPITAL YOU ARE ALLOWED ONLY FOUR SUPPORT PEOPLE DURING VISITATION HOURS ONLY (7 AM -8PM)   The support person(s) must pass our screening, gel in and out, and wear a mask at all times, including in the patient's room. Patients must also wear a mask when staff or their support person are in the room. Visitors GUEST BADGE MUST BE WORN VISIBLY  One adult visitor may remain with you overnight and MUST be in the room by 8 P.M.     Your procedure is scheduled on: 08/17/22   Report to [facility] Main Entrance    Report to admitting at : 5:15 AM   Call this number if you have problems the morning of surgery [phone]   Do not eat food :After Midnight.   After Midnight you may have the following liquids until: 4:15 AM DAY OF SURGERY  Water Black Coffee (sugar ok, NO MILK/CREAM OR CREAMERS)  Tea (sugar ok, NO MILK/CREAM OR CREAMERS) regular and decaf                             Plain Jell-O (NO RED)                                           Fruit ices (not with fruit pulp, NO RED)                                     Popsicles (NO RED)                                                                  Juice: apple, WHITE grape, WHITE cranberry Sports drinks like Gatorade (NO RED)              Drink  Ensure drink AT : 4:15 AM the day of surgery.    The day of surgery:  Drink ONE (1) Pre-Surgery Clear Ensure or G2 at AM the morning of surgery. Drink in one sitting. Do not sip.  This drink was given to you during your hospital  pre-op appointment visit. Nothing else to drink after completing the  Pre-Surgery Clear Ensure or G2.          If you have questions, please contact your surgeon's office.    Oral Hygiene is also  important to reduce your risk of infection.                                    Remember - BRUSH YOUR TEETH THE MORNING OF SURGERY WITH YOUR REGULAR TOOTHPASTE   Do NOT smoke after Midnight   Take these medicines the morning of surgery with A SIP  OF WATER: N/A                              You may not have any metal on your body including hair pins, jewelry, and body piercing             Do not wear lotions, powders, perfumes/cologne, or deodorant              Men may shave face and neck.   Do not bring valuables to the hospital. [place].   Contacts, dentures or bridgework may not be worn into surgery.   Bring small overnight bag day of surgery.   DO NOT [place]. PHARMACY WILL DISPENSE MEDICATIONS LISTED ON YOUR MEDICATION LIST TO YOU DURING YOUR ADMISSION [place]!    Patients discharged on the day of surgery will not be allowed to drive home.  Someone NEEDS to stay with you for the first 24 hours after anesthesia.   Special Instructions: Bring a copy of your healthcare power of attorney and living will documents         the day of surgery if you haven't scanned them before.              Please read over the following fact sheets you were given: IF YOU HAVE QUESTIONS ABOUT YOUR PRE-OP INSTRUCTIONS PLEASE CALL [phone]     [facility] - Preparing for Surgery Before surgery, you can play an important role.  Because skin is not sterile, your skin needs to be as free of germs as possible.  You can reduce the number of germs on your skin by washing with CHG (chlorahexidine gluconate) soap before surgery.  CHG is an antiseptic cleaner which kills germs and bonds with the skin to continue killing germs even after washing. Please DO NOT use if you have an allergy to CHG or antibacterial soaps.  If your skin becomes reddened/irritated stop using the CHG and inform your nurse when you arrive at Short  Stay. Do not shave (including legs and underarms) for at least 48 hours prior to the first CHG shower.  You may shave your face/neck. Please follow these instructions carefully:  1.  Shower with CHG Soap the night before surgery and the  morning of Surgery.  2.  If you choose to wash your hair, wash your hair first as usual with your  normal  shampoo.  3.  After you shampoo, rinse your hair and body thoroughly to remove the  shampoo.                           4.  Use CHG as you would any other liquid soap.  You can apply chg directly  to the skin and wash                       Gently with a scrungie or clean washcloth.  5.  Apply the CHG Soap to your body ONLY FROM THE NECK DOWN.   Do not use on face/ open                           Wound or open sores. Avoid contact with eyes, ears mouth and  genitals (private parts).                       Wash face,  Genitals (private parts) with your normal soap.             6.  Wash thoroughly, paying special attention to the area where your surgery  will be performed.  7.  Thoroughly rinse your body with warm water from the neck down.  8.  DO NOT shower/wash with your normal soap after using and rinsing off  the CHG Soap.                9.  Pat yourself dry with a clean towel.            10.  Wear clean pajamas.            11.  Place clean sheets on your bed the night of your first shower and do not  sleep with pets. Day of Surgery : Do not apply any lotions/deodorants the morning of surgery.  Please wear clean clothes to the hospital/surgery center.  FAILURE TO FOLLOW THESE INSTRUCTIONS MAY RESULT IN THE CANCELLATION OF YOUR SURGERY PATIENT SIGNATURE_________________________________  NURSE SIGNATURE__________________________________  ________________________________________________________________________   [name]  An incentive spirometer is a tool that can help keep your lungs clear and active. This tool measures how well you are  filling your lungs with each breath. Taking long deep breaths may help reverse or decrease the chance of developing breathing (pulmonary) problems (especially infection) following: A long period of time when you are unable to move or be active. BEFORE THE PROCEDURE  If the spirometer includes an indicator to show your best effort, your nurse or respiratory therapist will set it to a desired goal. If possible, sit up straight or lean slightly forward. Try not to slouch. Hold the incentive spirometer in an upright position. INSTRUCTIONS FOR USE  Sit on the edge of your bed if possible, or sit up as far as you can in bed or on a chair. Hold the incentive spirometer in an upright position. Breathe out normally. Place the mouthpiece in your mouth and seal your lips tightly around it. Breathe in slowly and as deeply as possible, raising the piston or the ball toward the top of the column. Hold your breath for 3-5 seconds or for as long as possible. Allow the piston or ball to fall to the bottom of the column. Remove the mouthpiece from your mouth and breathe out normally. Rest for a few seconds and repeat Steps 1 through 7 at least 10 times every 1-2 hours when you are awake. Take your time and take a few normal breaths between deep breaths. The spirometer may include an indicator to show your best effort. Use the indicator as a goal to work toward during each repetition. After each set of 10 deep breaths, practice coughing to be sure your lungs are clear. If you have an incision (the cut made at the time of surgery), support your incision when coughing by placing a pillow or rolled up towels firmly against it. Once you are able to get out of bed, walk around indoors and cough well. You may stop using the incentive spirometer when instructed by your caregiver.  RISKS AND COMPLICATIONS Take your time so you do not get dizzy or light-headed. If you are in pain, you may need to take or ask for pain  medication before doing incentive spirometry. It is harder  to take a deep breath if you are having pain. AFTER USE Rest and breathe slowly and easily. It can be helpful to keep track of a log of your progress. Your caregiver can provide you with a simple table to help with this. If you are using the spirometer at home, follow these instructions: [place] IF:  You are having difficultly using the spirometer. You have trouble using the spirometer as often as instructed. Your pain medication is not giving enough relief while using the spirometer. You develop fever of 100.5 F (38.1 C) or higher. SEEK IMMEDIATE MEDICAL CARE IF:  You cough up bloody sputum that had not been present before. You develop fever of 102 F (38.9 C) or greater. You develop worsening pain at or near the incision site. MAKE SURE YOU:  Understand these instructions. Will watch your condition. Will get help right away if you are not doing well or get worse. Document Released: 02/19/2007 Document Revised: 01/01/2012 Document Reviewed: 04/22/2007 [facility] Patient Information 2014 [place], [place].   ________________________________________________________________________

## 2022-08-08 ENCOUNTER — Encounter (HOSPITAL_COMMUNITY): Payer: Self-pay

## 2022-08-08 ENCOUNTER — Other Ambulatory Visit: Payer: Self-pay

## 2022-08-08 ENCOUNTER — Other Ambulatory Visit: Payer: Self-pay | Admitting: Physician Assistant

## 2022-08-08 ENCOUNTER — Encounter (HOSPITAL_COMMUNITY)
Admission: RE | Admit: 2022-08-08 | Discharge: 2022-08-08 | Disposition: A | Discharge status: Home / Self Care | Payer: BC Managed Care – PPO | Source: Ambulatory Visit | Attending: Orthopaedic Surgery | Admitting: Orthopaedic Surgery

## 2022-08-08 VITALS — BP 155/66 | HR 71 | Temp 97.5°F | Ht 73.0 in | Wt 179.0 lb

## 2022-08-08 DIAGNOSIS — Z01818 Encounter for other preprocedural examination: Secondary | ICD-10-CM | POA: Diagnosis present

## 2022-08-08 DIAGNOSIS — Z01812 Encounter for preprocedural laboratory examination: Secondary | ICD-10-CM | POA: Diagnosis not present

## 2022-08-08 HISTORY — DX: Unspecified osteoarthritis, unspecified site: M19.90

## 2022-08-08 HISTORY — DX: Other complications of anesthesia, initial encounter: T88.59XA

## 2022-08-08 HISTORY — DX: Other specified postprocedural states: Z98.890

## 2022-08-08 HISTORY — DX: Essential (primary) hypertension: I10

## 2022-08-08 LAB — CBC
HCT: 41.6 % (ref 39.0–52.0)
Hemoglobin: 14.3 g/dL (ref 13.0–17.0)
MCH: 31.7 pg (ref 26.0–34.0)
MCHC: 34.4 g/dL (ref 30.0–36.0)
MCV: 92.2 fL (ref 80.0–100.0)
Platelets: 205 10*3/uL (ref 150–400)
RBC: 4.51 MIL/uL (ref 4.22–5.81)
RDW: 13.2 % (ref 11.5–15.5)
WBC: 6.2 10*3/uL (ref 4.0–10.5)
nRBC: 0 % (ref 0.0–0.2)

## 2022-08-08 LAB — SURGICAL PCR SCREEN
MRSA, PCR: NEGATIVE
Staphylococcus aureus: NEGATIVE

## 2022-08-08 NOTE — Progress Notes (Signed)
For Short Stay: [place] appointment date: Date of COVID positive in last 72 days:  Bowel Prep reminder:   For Anesthesia: PCP - Dr. Lowella Bandy Cardiologist - Dr. Peggye Form  Chest x-ray -  EKG - 12/23/21: requested Stress Test -  ECHO - 06/06/19 Cardiac Cath -  Pacemaker/ICD device last checked: Pacemaker orders received: Device Rep notified:  Spinal Cord Stimulator:  Sleep Study -  CPAP -   Fasting Blood Sugar -  Checks Blood Sugar _____ times a day Date and result of last Hgb A1c-  Blood Thinner Instructions: Aspirin Instructions: Last Dose:  Activity level: Can go up a flight of stairs and activities of daily living without stopping and without chest pain and/or shortness of breath   Able to exercise without chest pain and/or shortness of breath   Unable to go up a flight of stairs without chest pain and/or shortness of breath     Anesthesia review:   Patient denies shortness of breath, fever, cough and chest pain at PAT appointment   Patient verbalized understanding of instructions that were given to them at the PAT appointment. Patient was also instructed that they will need to review over the PAT instructions again at home before surgery.

## 2022-08-16 ENCOUNTER — Telehealth: Payer: Self-pay | Admitting: Orthopaedic Surgery

## 2022-08-16 ENCOUNTER — Encounter (HOSPITAL_COMMUNITY): Payer: Self-pay | Admitting: Orthopaedic Surgery

## 2022-08-16 NOTE — Telephone Encounter (Signed)
Pt called in stating that he have some concerns about surgery.... Pt stated that he would like to ask Dr. Ninfa Linden some questions.... Pt stated that he have a slight chest cold and was wondering if its okay to still proceed with surgery.... Pt requesting callback at [phone] as soon as possible.Justin KitchenMarland KitchenMarland Potts

## 2022-08-17 ENCOUNTER — Other Ambulatory Visit: Payer: Self-pay

## 2022-08-17 ENCOUNTER — Encounter (HOSPITAL_COMMUNITY)
Admission: RE | Disposition: A | Discharge status: Home Health | Payer: Self-pay | Source: Ambulatory Visit | Attending: Orthopaedic Surgery

## 2022-08-17 ENCOUNTER — Encounter (HOSPITAL_COMMUNITY): Payer: Self-pay | Admitting: Orthopaedic Surgery

## 2022-08-17 ENCOUNTER — Ambulatory Visit (HOSPITAL_COMMUNITY): Payer: BC Managed Care – PPO | Admitting: Anesthesiology

## 2022-08-17 ENCOUNTER — Observation Stay (HOSPITAL_COMMUNITY): Payer: BC Managed Care – PPO

## 2022-08-17 ENCOUNTER — Observation Stay (HOSPITAL_COMMUNITY)
Admission: RE | Admit: 2022-08-17 | Discharge: 2022-08-18 | Disposition: A | Discharge status: Home Health | Payer: BC Managed Care – PPO | Source: Ambulatory Visit | Attending: Orthopaedic Surgery | Admitting: Orthopaedic Surgery

## 2022-08-17 DIAGNOSIS — Z96652 Presence of left artificial knee joint: Secondary | ICD-10-CM

## 2022-08-17 DIAGNOSIS — M1712 Unilateral primary osteoarthritis, left knee: Principal | ICD-10-CM | POA: Diagnosis present

## 2022-08-17 DIAGNOSIS — I10 Essential (primary) hypertension: Secondary | ICD-10-CM | POA: Diagnosis not present

## 2022-08-17 DIAGNOSIS — Z79899 Other long term (current) drug therapy: Secondary | ICD-10-CM | POA: Diagnosis not present

## 2022-08-17 HISTORY — PX: TOTAL KNEE ARTHROPLASTY: SHX125

## 2022-08-17 SURGERY — ARTHROPLASTY, KNEE, TOTAL
Anesthesia: Regional | Site: Knee | Laterality: Left

## 2022-08-17 MED ORDER — LACTATED RINGERS IV SOLN: INTRAVENOUS | Status: DC

## 2022-08-17 MED ORDER — SODIUM CHLORIDE 0.9 % IV SOLN: INTRAVENOUS | Status: DC

## 2022-08-17 MED ORDER — KETOROLAC TROMETHAMINE 15 MG/ML IJ SOLN
15.0000 mg | Freq: Four times a day (QID) | INTRAMUSCULAR | Status: AC
  Administered 2022-08-17 – 2022-08-18 (×4): 15 mg via INTRAVENOUS
  Filled 2022-08-17 (×4): qty 1

## 2022-08-17 MED ORDER — POVIDONE-IODINE 10 % EX SWAB
2.0000 | Freq: Once | CUTANEOUS | Status: AC
  Administered 2022-08-17: 2 via TOPICAL

## 2022-08-17 MED ORDER — DEXAMETHASONE SODIUM PHOSPHATE 10 MG/ML IJ SOLN
INTRAMUSCULAR | Status: AC
  Filled 2022-08-17: qty 2

## 2022-08-17 MED ORDER — MENTHOL 3 MG MT LOZG: 1.0000 | LOZENGE | OROMUCOSAL | Status: DC | PRN

## 2022-08-17 MED ORDER — BUPIVACAINE-EPINEPHRINE 0.25% -1:200000 IJ SOLN
INTRAMUSCULAR | Status: DC | PRN
  Administered 2022-08-17: 30 mL

## 2022-08-17 MED ORDER — SODIUM CHLORIDE 0.9 % IR SOLN
Status: DC | PRN
  Administered 2022-08-17: 1000 mL

## 2022-08-17 MED ORDER — METHOCARBAMOL 500 MG PO TABS
500.0000 mg | ORAL_TABLET | Freq: Four times a day (QID) | ORAL | Status: DC | PRN
  Administered 2022-08-17: 500 mg via ORAL
  Filled 2022-08-17: qty 1

## 2022-08-17 MED ORDER — METOCLOPRAMIDE HCL 5 MG/ML IJ SOLN: 5.0000 mg | Freq: Three times a day (TID) | INTRAMUSCULAR | Status: DC | PRN

## 2022-08-17 MED ORDER — ONDANSETRON HCL 4 MG PO TABS: 4.0000 mg | ORAL_TABLET | Freq: Four times a day (QID) | ORAL | Status: DC | PRN

## 2022-08-17 MED ORDER — METOCLOPRAMIDE HCL 5 MG PO TABS: 5.0000 mg | ORAL_TABLET | Freq: Three times a day (TID) | ORAL | Status: DC | PRN

## 2022-08-17 MED ORDER — FENTANYL CITRATE (PF) 100 MCG/2ML IJ SOLN
INTRAMUSCULAR | Status: DC | PRN
  Administered 2022-08-17: 25 ug via INTRAVENOUS
  Administered 2022-08-17: 50 ug via INTRAVENOUS
  Administered 2022-08-17: 25 ug via INTRAVENOUS

## 2022-08-17 MED ORDER — DOCUSATE SODIUM 100 MG PO CAPS
100.0000 mg | ORAL_CAPSULE | Freq: Two times a day (BID) | ORAL | Status: DC
  Administered 2022-08-17 – 2022-08-18 (×2): 100 mg via ORAL
  Filled 2022-08-17 (×2): qty 1

## 2022-08-17 MED ORDER — ACETAMINOPHEN 325 MG PO TABS: 325.0000 mg | ORAL_TABLET | Freq: Four times a day (QID) | ORAL | Status: DC | PRN

## 2022-08-17 MED ORDER — PROPOFOL 1000 MG/100ML IV EMUL
INTRAVENOUS | Status: AC
  Filled 2022-08-17: qty 100

## 2022-08-17 MED ORDER — CEFAZOLIN SODIUM-DEXTROSE 1-4 GM/50ML-% IV SOLN
1.0000 g | Freq: Four times a day (QID) | INTRAVENOUS | Status: AC
  Administered 2022-08-17 (×2): 1 g via INTRAVENOUS
  Filled 2022-08-17 (×2): qty 50

## 2022-08-17 MED ORDER — SENNOSIDES-DOCUSATE SODIUM 8.6-50 MG PO TABS: 1.0000 | ORAL_TABLET | Freq: Every evening | ORAL | Status: DC | PRN

## 2022-08-17 MED ORDER — LISINOPRIL 20 MG PO TABS
40.0000 mg | ORAL_TABLET | Freq: Every day | ORAL | Status: DC
  Administered 2022-08-17: 40 mg via ORAL
  Filled 2022-08-17: qty 2

## 2022-08-17 MED ORDER — OXYCODONE HCL 5 MG PO TABS
5.0000 mg | ORAL_TABLET | ORAL | Status: DC | PRN
  Administered 2022-08-18: 10 mg via ORAL
  Filled 2022-08-17: qty 2

## 2022-08-17 MED ORDER — PROPOFOL 10 MG/ML IV BOLUS
INTRAVENOUS | Status: AC
  Filled 2022-08-17: qty 20

## 2022-08-17 MED ORDER — HYDROMORPHONE HCL 1 MG/ML IJ SOLN: 0.5000 mg | INTRAMUSCULAR | Status: DC | PRN

## 2022-08-17 MED ORDER — ORAL CARE MOUTH RINSE: 15.0000 mL | Freq: Once | OROMUCOSAL | Status: AC

## 2022-08-17 MED ORDER — 0.9 % SODIUM CHLORIDE (POUR BTL) OPTIME
TOPICAL | Status: DC | PRN
  Administered 2022-08-17: 1000 mL

## 2022-08-17 MED ORDER — BUPIVACAINE IN DEXTROSE 0.75-8.25 % IT SOLN
INTRATHECAL | Status: DC | PRN
  Administered 2022-08-17: 2 mL via INTRATHECAL

## 2022-08-17 MED ORDER — ONDANSETRON HCL 4 MG/2ML IJ SOLN: 4.0000 mg | Freq: Four times a day (QID) | INTRAMUSCULAR | Status: DC | PRN

## 2022-08-17 MED ORDER — ADULT MULTIVITAMIN W/MINERALS CH
1.0000 | ORAL_TABLET | Freq: Every day | ORAL | Status: DC
  Administered 2022-08-18: 1 via ORAL
  Filled 2022-08-17: qty 1

## 2022-08-17 MED ORDER — PANTOPRAZOLE SODIUM 40 MG PO TBEC: 40.0000 mg | DELAYED_RELEASE_TABLET | Freq: Every day | ORAL | Status: DC

## 2022-08-17 MED ORDER — METHOCARBAMOL 500 MG IVPB - SIMPLE MED
INTRAVENOUS | Status: AC
  Administered 2022-08-17: 500 mg via INTRAVENOUS
  Filled 2022-08-17: qty 55

## 2022-08-17 MED ORDER — ROSUVASTATIN CALCIUM 10 MG PO TABS
10.0000 mg | ORAL_TABLET | Freq: Every day | ORAL | Status: DC
  Administered 2022-08-17: 10 mg via ORAL
  Filled 2022-08-17: qty 1

## 2022-08-17 MED ORDER — METHOCARBAMOL 500 MG IVPB - SIMPLE MED: 500.0000 mg | Freq: Four times a day (QID) | INTRAVENOUS | Status: DC | PRN

## 2022-08-17 MED ORDER — TRANEXAMIC ACID-NACL 1000-0.7 MG/100ML-% IV SOLN
1000.0000 mg | INTRAVENOUS | Status: AC
  Administered 2022-08-17: 1000 mg via INTRAVENOUS
  Filled 2022-08-17: qty 100

## 2022-08-17 MED ORDER — DIPHENHYDRAMINE HCL 12.5 MG/5ML PO ELIX: 12.5000 mg | ORAL_SOLUTION | ORAL | Status: DC | PRN

## 2022-08-17 MED ORDER — DEXAMETHASONE SODIUM PHOSPHATE 10 MG/ML IJ SOLN
INTRAMUSCULAR | Status: DC | PRN
  Administered 2022-08-17: 8 mg via INTRAVENOUS

## 2022-08-17 MED ORDER — HYDROMORPHONE HCL 1 MG/ML IJ SOLN
INTRAMUSCULAR | Status: AC
  Administered 2022-08-17: 0.5 mg via INTRAVENOUS
  Filled 2022-08-17: qty 1

## 2022-08-17 MED ORDER — PANTOPRAZOLE SODIUM 40 MG PO TBEC
40.0000 mg | DELAYED_RELEASE_TABLET | Freq: Every day | ORAL | Status: DC
  Administered 2022-08-17: 40 mg via ORAL
  Filled 2022-08-17: qty 1

## 2022-08-17 MED ORDER — ONDANSETRON HCL 4 MG/2ML IJ SOLN
INTRAMUSCULAR | Status: AC
  Filled 2022-08-17: qty 4

## 2022-08-17 MED ORDER — PHENOL 1.4 % MT LIQD: 1.0000 | OROMUCOSAL | Status: DC | PRN

## 2022-08-17 MED ORDER — MIDAZOLAM HCL 5 MG/5ML IJ SOLN
INTRAMUSCULAR | Status: DC | PRN
  Administered 2022-08-17 (×2): 1 mg via INTRAVENOUS

## 2022-08-17 MED ORDER — DEXMEDETOMIDINE HCL IN NACL 80 MCG/20ML IV SOLN
INTRAVENOUS | Status: DC | PRN
  Administered 2022-08-17 (×2): 8 ug via BUCCAL

## 2022-08-17 MED ORDER — FENTANYL CITRATE (PF) 100 MCG/2ML IJ SOLN
INTRAMUSCULAR | Status: AC
  Filled 2022-08-17: qty 2

## 2022-08-17 MED ORDER — CEFAZOLIN SODIUM-DEXTROSE 2-4 GM/100ML-% IV SOLN
2.0000 g | INTRAVENOUS | Status: AC
  Administered 2022-08-17: 2 g via INTRAVENOUS
  Filled 2022-08-17: qty 100

## 2022-08-17 MED ORDER — ONDANSETRON HCL 4 MG/2ML IJ SOLN
INTRAMUSCULAR | Status: DC | PRN
  Administered 2022-08-17: 4 mg via INTRAVENOUS

## 2022-08-17 MED ORDER — ALUM & MAG HYDROXIDE-SIMETH 200-200-20 MG/5ML PO SUSP: 30.0000 mL | ORAL | Status: DC | PRN

## 2022-08-17 MED ORDER — ASPIRIN 81 MG PO CHEW
81.0000 mg | CHEWABLE_TABLET | Freq: Two times a day (BID) | ORAL | Status: DC
  Administered 2022-08-17 – 2022-08-18 (×2): 81 mg via ORAL
  Filled 2022-08-17 (×2): qty 1

## 2022-08-17 MED ORDER — HYDROMORPHONE HCL 2 MG PO TABS
2.0000 mg | ORAL_TABLET | ORAL | Status: DC | PRN
  Administered 2022-08-17: 3 mg via ORAL
  Filled 2022-08-17: qty 2

## 2022-08-17 MED ORDER — LIDOCAINE HCL (PF) 2 % IJ SOLN
INTRAMUSCULAR | Status: AC
  Filled 2022-08-17: qty 10

## 2022-08-17 MED ORDER — DEXMEDETOMIDINE HCL IN NACL 80 MCG/20ML IV SOLN
INTRAVENOUS | Status: AC
  Filled 2022-08-17: qty 20

## 2022-08-17 MED ORDER — PROMETHAZINE HCL 25 MG/ML IJ SOLN: 6.2500 mg | INTRAMUSCULAR | Status: DC | PRN

## 2022-08-17 MED ORDER — CHLORHEXIDINE GLUCONATE 0.12 % MT SOLN
15.0000 mL | Freq: Once | OROMUCOSAL | Status: AC
  Administered 2022-08-17: 15 mL via OROMUCOSAL

## 2022-08-17 MED ORDER — PROPOFOL 10 MG/ML IV BOLUS
INTRAVENOUS | Status: DC | PRN
  Administered 2022-08-17 (×2): 20 mg via INTRAVENOUS

## 2022-08-17 MED ORDER — HYDROMORPHONE HCL 1 MG/ML IJ SOLN: 0.2500 mg | INTRAMUSCULAR | Status: DC | PRN

## 2022-08-17 MED ORDER — LIDOCAINE 2% (20 MG/ML) 5 ML SYRINGE
INTRAMUSCULAR | Status: DC | PRN
  Administered 2022-08-17: 30 mg via INTRAVENOUS
  Administered 2022-08-17: 50 mg via INTRAVENOUS

## 2022-08-17 MED ORDER — BUPIVACAINE-EPINEPHRINE (PF) 0.25% -1:200000 IJ SOLN
INTRAMUSCULAR | Status: AC
  Filled 2022-08-17: qty 30

## 2022-08-17 MED ORDER — ROPIVACAINE HCL 5 MG/ML IJ SOLN
INTRAMUSCULAR | Status: DC | PRN
  Administered 2022-08-17: 20 mL via PERINEURAL

## 2022-08-17 MED ORDER — MIDAZOLAM HCL 2 MG/2ML IJ SOLN
INTRAMUSCULAR | Status: AC
  Filled 2022-08-17: qty 2

## 2022-08-17 MED ORDER — PROPOFOL 500 MG/50ML IV EMUL
INTRAVENOUS | Status: DC | PRN
  Administered 2022-08-17: 75 ug/kg/min via INTRAVENOUS

## 2022-08-17 SURGICAL SUPPLY — 55 items
BAG COUNTER SPONGE SURGICOUNT (BAG) IMPLANT
BAG ZIPLOCK 12X15 (MISCELLANEOUS) ×1 IMPLANT
BENZOIN TINCTURE PRP APPL 2/3 (GAUZE/BANDAGES/DRESSINGS) IMPLANT
BLADE SAG 18X100X1.27 (BLADE) ×1 IMPLANT
BLADE SURG SZ10 CARB STEEL (BLADE) ×2 IMPLANT
BNDG ELASTIC 6X5.8 VLCR STR LF (GAUZE/BANDAGES/DRESSINGS) ×2 IMPLANT
BOWL SMART MIX CTS (DISPOSABLE) IMPLANT
COMP FEM PS KNEE STD 10 LT (Knees) ×1 IMPLANT
COMP TIB PORS SZ G LT (Knees) ×1 IMPLANT
COMPONENT FEM PS KN STD 10 LT (Knees) IMPLANT
COMPONET TIB PORS SZ G LT (Knees) IMPLANT
COOLER ICEMAN CLASSIC (MISCELLANEOUS) ×1 IMPLANT
COVER SURGICAL LIGHT HANDLE (MISCELLANEOUS) ×1 IMPLANT
CUFF TOURN SGL QUICK 34 (TOURNIQUET CUFF) ×1
CUFF TRNQT CYL 34X4.125X (TOURNIQUET CUFF) ×1 IMPLANT
DRAPE INCISE IOBAN 66X45 STRL (DRAPES) ×1 IMPLANT
DRAPE U-SHAPE 47X51 STRL (DRAPES) ×1 IMPLANT
DURAPREP 26ML APPLICATOR (WOUND CARE) ×1 IMPLANT
ELECT BLADE TIP CTD 4 INCH (ELECTRODE) ×1 IMPLANT
ELECT REM PT RETURN 15FT ADLT (MISCELLANEOUS) ×1 IMPLANT
GAUZE PAD ABD 8X10 STRL (GAUZE/BANDAGES/DRESSINGS) ×2 IMPLANT
GAUZE SPONGE 4X4 12PLY STRL (GAUZE/BANDAGES/DRESSINGS) ×1 IMPLANT
GAUZE XEROFORM 1X8 LF (GAUZE/BANDAGES/DRESSINGS) IMPLANT
GLOVE BIO SURGEON STRL SZ7.5 (GLOVE) ×1 IMPLANT
GLOVE BIOGEL PI IND STRL 8 (GLOVE) ×2 IMPLANT
GLOVE ECLIPSE 8.0 STRL XLNG CF (GLOVE) ×1 IMPLANT
GOWN STRL REUS W/ TWL XL LVL3 (GOWN DISPOSABLE) ×2 IMPLANT
GOWN STRL REUS W/TWL XL LVL3 (GOWN DISPOSABLE) ×2
HANDPIECE INTERPULSE COAX TIP (DISPOSABLE) ×1
HDLS TROCR DRIL PIN KNEE 75 (PIN) ×1
HOLDER FOLEY CATH W/STRAP (MISCELLANEOUS) IMPLANT
IMMOBILIZER KNEE 20 (SOFTGOODS) ×1
IMMOBILIZER KNEE 20 THIGH 36 (SOFTGOODS) ×1 IMPLANT
INSERT FIXED AS PERS SZ8-11 LT (Insert) IMPLANT
KIT TURNOVER KIT A (KITS) IMPLANT
NS IRRIG 1000ML POUR BTL (IV SOLUTION) ×1 IMPLANT
PACK TOTAL KNEE CUSTOM (KITS) ×1 IMPLANT
PAD COLD SHLDR WRAP-ON (PAD) ×1 IMPLANT
PADDING CAST COTTON 6X4 STRL (CAST SUPPLIES) ×2 IMPLANT
PATELLA STD SZ 38X10 (Miscellaneous) IMPLANT
PIN DRILL HDLS TROCAR 75 4PK (PIN) IMPLANT
PROTECTOR NERVE ULNAR (MISCELLANEOUS) ×1 IMPLANT
SCREW FEMALE HEX FIX 25X2.5 (ORTHOPEDIC DISPOSABLE SUPPLIES) IMPLANT
SET HNDPC FAN SPRY TIP SCT (DISPOSABLE) ×1 IMPLANT
SET PAD KNEE POSITIONER (MISCELLANEOUS) ×1 IMPLANT
STAPLER VISISTAT 35W (STAPLE) IMPLANT
STRIP CLOSURE SKIN 1/2X4 (GAUZE/BANDAGES/DRESSINGS) IMPLANT
SUT MNCRL AB 4-0 PS2 18 (SUTURE) IMPLANT
SUT VIC AB 0 CT1 27 (SUTURE) ×1
SUT VIC AB 0 CT1 27XBRD ANTBC (SUTURE) ×1 IMPLANT
SUT VIC AB 1 CT1 36 (SUTURE) ×2 IMPLANT
SUT VIC AB 2-0 CT1 27 (SUTURE) ×2
SUT VIC AB 2-0 CT1 TAPERPNT 27 (SUTURE) ×2 IMPLANT
TRAY FOLEY MTR SLVR 16FR STAT (SET/KITS/TRAYS/PACK) IMPLANT
WATER STERILE IRR 1000ML POUR (IV SOLUTION) ×2 IMPLANT

## 2022-08-17 NOTE — Transfer of Care (Signed)
Immediate Anesthesia Transfer of Care Note  Patient: [name]  Procedure(s) Performed: Procedure(s): LEFT TOTAL KNEE ARTHROPLASTY (Left)  Patient Location: PACU  Anesthesia Type:Spinal  Level of Consciousness:  sedated, patient cooperative and responds to stimulation  Airway & Oxygen Therapy:Patient Spontanous Breathing and Patient connected to face mask oxgen  Post-op Assessment:  Report given to PACU RN and Post -op Vital signs reviewed and stable  Post vital signs:  Reviewed and stable  Last Vitals:  Vitals:   08/17/22 0624 08/17/22 0938  BP: (!) 182/79   Pulse: 83   Resp: 16   Temp: 36.8 C 36.8 C  SpO2: 94%     Complications: No apparent anesthesia complications

## 2022-08-17 NOTE — Anesthesia Procedure Notes (Addendum)
Anesthesia Regional Block: Adductor canal block   Pre-Anesthetic Checklist: , timeout performed,  Correct Patient, Correct Site, Correct Laterality,  Correct Procedure, Correct Position, site marked,  Risks and benefits discussed,  Surgical consent,  Pre-op evaluation,  At surgeon's request and post-op pain management  Laterality: Left  Prep: chloraprep       Needles:  Injection technique: Single-shot  Needle Type: Stimiplex     Needle Length: 9cm  Needle Gauge: 21     Additional Needles:   Narrative:  Start time: 08/17/2022 6:53 AM End time: 08/17/2022 6:58 AM Injection made incrementally with aspirations every 5 mL.  Performed by: Personally  Anesthesiologist: Lynda Rainwater, MD

## 2022-08-17 NOTE — Evaluation (Signed)
Physical Therapy Evaluation Patient Details Name: [name] MRN: [id number] DOB: Jul 23, 1961 Today's Date: 08/17/2022  History of Present Illness  Pt s/p L TKR  Clinical Impression  Pt s/p L TKR and presents with decreased L LE strength/ROM and post op pain limiting functional mobility.  Pt should progress to dc home with family assist.     Recommendations for follow up therapy are one component of a multi-disciplinary discharge planning process, led by the attending physician.  Recommendations may be updated based on patient status, additional functional criteria and insurance authorization.  Follow Up Recommendations Follow physician's recommendations for discharge plan and follow up therapies      Assistance Recommended at Discharge Intermittent Supervision/Assistance  Patient can return home with the following  A little help with walking and/or transfers;A little help with bathing/dressing/bathroom;Assist for transportation;Help with stairs or ramp for entrance;Assistance with cooking/housework    Equipment Recommendations Rolling walker (2 wheels)  Recommendations for Other Services       Functional Status Assessment Patient has had a recent decline in their functional status and demonstrates the ability to make significant improvements in function in a reasonable and predictable amount of time.     Precautions / Restrictions Precautions Precautions: Knee;Fall Restrictions Weight Bearing Restrictions: No LLE Weight Bearing: Weight bearing as tolerated      Mobility  Bed Mobility Overal bed mobility: Needs Assistance Bed Mobility: Supine to Sit     Supine to sit: Min assist     General bed mobility comments: cues for sequence with min assist to manage L LE    Transfers Overall transfer level: Needs assistance Equipment used: Rolling walker (2 wheels) Transfers: Sit to/from Stand Sit to Stand: Min assist           General transfer comment: cues for  LE management and use of UEs to self assist    Ambulation/Gait Ambulation/Gait assistance: Min assist Gait Distance (Feet): 28 Feet Assistive device: Rolling walker (2 wheels) Gait Pattern/deviations: Step-to pattern, Decreased step length - right, Decreased step length - left, Shuffle, Trunk flexed Gait velocity: decr     General Gait Details: cues for sequence, posture and position from RW; distance ltd by increasing nausea  Stairs            Wheelchair Mobility    Modified Rankin (Stroke Patients Only)       Balance Overall balance assessment: Mild deficits observed, not formally tested                                           Pertinent Vitals/Pain Pain Assessment Pain Assessment: 0-10 Pain Score: 4  Pain Location: L knee Pain Descriptors / Indicators: Aching, Burning, Sore Pain Intervention(s): Limited activity within patient's tolerance, Monitored during session, Premedicated before session, Ice applied    Home Living Family/patient expects to be discharged to:: Private residence Living Arrangements: Spouse/significant other Available Help at Discharge: Available 24 hours/day Type of Home: House Home Access: Stairs to enter Entrance Stairs-Rails: None Entrance Stairs-Number of Steps: 1+1   Home Layout: One level Home Equipment: None      Prior Function Prior Level of Function : Independent/Modified Independent             Mobility Comments: Works in Automotive engineer        Extremity/Trunk Assessment   Upper Extremity Assessment Upper  Extremity Assessment: Overall WFL for tasks assessed    Lower Extremity Assessment Lower Extremity Assessment: LLE deficits/detail    Cervical / Trunk Assessment Cervical / Trunk Assessment: Normal  Communication   Communication: No difficulties  Cognition Arousal/Alertness: Awake/alert Behavior During Therapy: WFL for tasks assessed/performed Overall Cognitive  Status: Within Functional Limits for tasks assessed                                          General Comments      Exercises Total Joint Exercises Ankle Circles/Pumps: AROM, Both, 15 reps, Supine   Assessment/Plan    PT Assessment Patient needs continued PT services  PT Problem List Decreased strength;Decreased range of motion;Decreased activity tolerance;Decreased balance;Decreased mobility;Decreased knowledge of use of DME;Pain       PT Treatment Interventions DME instruction;Functional mobility training;Stair training;Gait training;Therapeutic activities;Therapeutic exercise;Patient/family education    PT Goals (Current goals can be found in the Care Plan section)  Acute Rehab PT Goals Patient Stated Goal: Regain IND PT Goal Formulation: With patient Time For Goal Achievement: 08/24/22 Potential to Achieve Goals: Good    Frequency 7X/week     Co-evaluation               AM-PAC PT "6 Clicks" Mobility  Outcome Measure Help needed turning from your back to your side while in a flat bed without using bedrails?: A Little Help needed moving from lying on your back to sitting on the side of a flat bed without using bedrails?: A Little Help needed moving to and from a bed to a chair (including a wheelchair)?: A Little Help needed standing up from a chair using your arms (e.g., wheelchair or bedside chair)?: A Little Help needed to walk in hospital room?: A Little Help needed climbing 3-5 steps with a railing? : A Lot 6 Click Score: 17    End of Session Equipment Utilized During Treatment: Gait belt;Left knee immobilizer Activity Tolerance: Other (comment) (nausea) Patient left: in chair;with call bell/phone within reach;with chair alarm set;with family/visitor present Nurse Communication: Mobility status PT Visit Diagnosis: Difficulty in walking, not elsewhere classified (R26.2)    Time: 3154-0086 PT Time Calculation (min) (ACUTE ONLY): 28  min   Charges:   PT Evaluation $PT Eval Low Complexity: 1 Low PT Treatments $Gait Training: 8-22 mins        [place] Pager [phone] Office [phone]   Terius Jacuinde 08/17/2022, 2:45 PM

## 2022-08-17 NOTE — Anesthesia Preprocedure Evaluation (Signed)
Anesthesia Evaluation  Patient identified by MRN, date of birth, ID band Patient awake    Reviewed: Allergy & Precautions, NPO status , Patient's Chart, lab work & pertinent test results  History of Anesthesia Complications (+) PONV and history of anesthetic complications  Airway Mallampati: II  TM Distance: >3 FB Neck ROM: Full    Dental no notable dental hx.    Pulmonary neg pulmonary ROS,    Pulmonary exam normal breath sounds clear to auscultation       Cardiovascular hypertension, Pt. on medications negative cardio ROS Normal cardiovascular exam Rhythm:Regular Rate:Normal     Neuro/Psych negative neurological ROS  negative psych ROS   GI/Hepatic Neg liver ROS, GERD  ,  Endo/Other  negative endocrine ROS  Renal/GU negative Renal ROS  negative genitourinary   Musculoskeletal  (+) Arthritis , Osteoarthritis,    Abdominal   Peds negative pediatric ROS (+)  Hematology negative hematology ROS (+)   Anesthesia Other Findings   Reproductive/Obstetrics negative OB ROS                             Anesthesia Physical Anesthesia Plan  ASA: 2  Anesthesia Plan: Regional and Spinal   Post-op Pain Management: Regional block*   Induction: Intravenous  PONV Risk Score and Plan: 2 and Ondansetron, Midazolam and Treatment may vary due to age or medical condition  Airway Management Planned: Simple Face Mask  Additional Equipment:   Intra-op Plan:   Post-operative Plan:   Informed Consent: I have reviewed the patients History and Physical, chart, labs and discussed the procedure including the risks, benefits and alternatives for the proposed anesthesia with the patient or authorized representative who has indicated his/her understanding and acceptance.     Dental advisory given  Plan Discussed with: CRNA  Anesthesia Plan Comments:         Anesthesia Quick Evaluation

## 2022-08-17 NOTE — Anesthesia Postprocedure Evaluation (Signed)
Anesthesia Post Note  Patient: ZAKARIA SEDOR  Procedure(s) Performed: LEFT TOTAL KNEE ARTHROPLASTY (Left: Knee)     Patient location during evaluation: PACU Anesthesia Type: Regional and Spinal Level of consciousness: awake and alert Pain management: pain level controlled Vital Signs Assessment: post-procedure vital signs reviewed and stable Respiratory status: spontaneous breathing, nonlabored ventilation and respiratory function stable Cardiovascular status: blood pressure returned to baseline and stable Postop Assessment: no apparent nausea or vomiting Anesthetic complications: no   No notable events documented.  Last Vitals:  Vitals:   08/17/22 1030 08/17/22 1050  BP: 131/76 (!) 145/79  Pulse: 66 75  Resp: 16 16  Temp: 36.6 C (!) 36.4 C  SpO2: 96% 95%    Last Pain:  Vitals:   08/17/22 1030  TempSrc:   PainSc: [place]

## 2022-08-17 NOTE — Op Note (Signed)
Operative Note  Date of operation: 08/17/2022 Preoperative diagnosis: Left knee severe osteoarthritis Postoperative diagnosis: Same  Procedure: Left press-fit total knee arthroplasty  Implants: Biomet/Zimmer press-fit knee system with size 10 left standard CR femur, size G left 2 peg TM tibial tray, 12 mm medial congruent left polyethylene insert, 38 mm TM patella button  Surgeon: Lind Guest. Ninfa Linden, MD Assistant: Benita Stabile, PA-C  Anesthesia:  1) left lower extremity adductor canal block           2) spinal           3) local Antibiotics: 2 g IV Ancef Tourniquet time: 76 minutes EBL: Less than 762 cc Complications: None  Indications: The patient is an active 61 year old gentleman with debilitating arthritis involving his left knee.  He has a remote history of an Designer, multimedia.  With time his develop bone-on-bone wear in all 3 compartments of the knee.  His knee pain is daily and his x-rays show severe end-stage arthritis of the left knee.  At this point his left knee is definitely affecting his mobility, his quality of life, and his actives day living to the point he does wish to proceed with a knee replacement and we have recommended this as well.  We had a long and thorough discussion about the risk of acute blood loss anemia, nerve vessel injury, fracture, infection, implant failure, DVT, and wound healing issues.  We talked about her goals being decreased pain, improve mobility, and improve quality of life.  Procedure description after informed consent was obtained and the appropriate left knee was marked, anesthesia obtained a left lower extremity regional block in the holding room and the patient was brought to the operating room and set up on the operating table.  Spinal anesthesia was obtained.  He was then laid in supine position on the operating table and a Foley catheter was placed.  A nonsterile tourniquet placed around his upper left thigh.  His left thigh, knee,  leg, and ankle were prepped and draped with DuraPrep and sterile drapes including a sterile stockinette.  A timeout was called and he was identified as the correct patient and the correct left knee.  An Esmarch was then used to wrap out the leg and the tourniquet was inflated to 300 mm of pressure.  I then made a direct midline incision over the patella and carried this proximally distally.  I dissected down the knee joint carried out a medial parapatellar arthrotomy finding a very large joint effusion.  There was significant and full-thickness cartilage wear in all 3 compartments of the knee.  We did remove remnants of the ACL reconstruction as well as the medial lateral meniscus.  On radiographic assessment the interference screw on the tibia is buried deep in the bone and was not exposed thus we felt it was better to proceed with a TM press-fit tibial component.  Of note he is young and has very good quality bone.  He is thin and not a diabetic.  We first made our proximal tibia cut correction for varus and valgus and a 3 degree slope.  We made this cut to take 2 mm off the high side.  He had a significant flexion contracture so we backed this down a few more millimeters.  We then used an intramedullary drill to open up the femoral canal for our distal femoral cutting guide setting this for left knee at 5 degrees externally rotated for 10 mm distal femoral cut.  We made the  cut without difficulty and was able to see the femur interference screw and remove this easily.  We then brought the knee back down to full extension and remove more debris from the back of the knee and a 10 mm extension block was very tight so we then went back to the femur and cut 2 mm off the femur and this allowed [place] to have full extension.  We then went back to the femur for femoral sizing guide based off the epicondylar axis.  Based off of this we chose a size 10 femur.  We put a 4-in-1 cutting block for size 10 femur and made her  anterior and posterior cuts followed by her chamfer cuts.  We then backed the tibia and chose a size G tibial tray for coverage over the tibial plateau so the rotation of the tibial tubercle and the femur.  We did our 2 drill holes for the TM tibial tray.  With the trial size G left tibial tray in place we trialed a size 10 left femur.  We went up to a 12 mm medial congruent left polyethylene insert trial and gave [place] good range of motion and stability.  We then made a patella cut and drilled a single hole for a TM size 38 patella button.  We then removed all transportation of the knee and irrigate the knee with normal saline solution.  We dried the knee real well and then placed our Marcaine with epinephrine around the arthrotomy.  Next with the knee in a flexed position we placed our press-fit G left TM Biomet Zimmer tibial tray followed by press fitting our left 10 standard CR femur.  We placed our 12 mm medial congruent polythene insert and press-fit our size 38 TN patella button.  We then put the knee through several cycles of motion and we are pleased with range of motion and stability.  The tourniquet was let down and the stasis was obtained electrocautery.  We then closed the arthrotomy with interrupted #1 Vicryl suture followed by 0 Vicryl close deep tissue and 2-0 Vicryl to close subcutaneous tissue.  The skin was closed with staples.  Well-padded sterile dressings applied.  The patient was taken recovery room in stable condition with all final counts being correct and no complications noted.  Of note Rexene Edison, PA-C did assist the entire case from beginning to end and his assistance was medically necessary for soft tissue retraction and management, helping guide implant placement and a layered closure of the wound.

## 2022-08-17 NOTE — H&P (Signed)
TOTAL KNEE ADMISSION H&P  Patient is being admitted for left total knee arthroplasty.  Subjective:  Chief Complaint:left knee pain.  HPI: [name], 61 y.o. male, has a history of pain and functional disability in the left knee due to arthritis and has failed non-surgical conservative treatments for greater than 12 weeks to includeNSAID's and/or analgesics, corticosteriod injections, flexibility and strengthening excercises, and activity modification.  Onset of symptoms was gradual, starting 3 years ago with gradually worsening course since that time. The patient noted prior procedures on the knee to include  arthroscopy and ACL reconstruction on the left knee(s).  Patient currently rates pain in the left knee(s) at 9 out of 10 with activity. Patient has night pain, worsening of pain with activity and weight bearing, pain that interferes with activities of daily living, pain with passive range of motion, crepitus, and joint swelling.  Patient has evidence of subchondral sclerosis, periarticular osteophytes, and joint space narrowing by imaging studies. There is no active infection.  Patient Active Problem List   Diagnosis Date Noted   Unilateral primary osteoarthritis, left knee 07/19/2022   Restless leg syndrome 12/18/2016   DDD (degenerative disc disease), lumbar 11/17/2016   Infertility male 11/17/2016   Chronic pain of left knee 11/17/2016   Gastroesophageal reflux disease without esophagitis 01/17/2016   Fatty liver disease, nonalcoholic 62/83/6629   Gynecomastia 01/17/2016   Skin lesion 01/17/2016   Hydrocele, left 09/08/2015   Age-related macular degeneration 04/15/2015   Endophthalmitis, left eye 03/02/2015   Cystoid macular edema 02/09/2012   Amblyopia 12/28/2011   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   Past Medical History:  Diagnosis Date   Arthritis    Complication of anesthesia    Fatty liver disease, nonalcoholic    GERD (gastroesophageal reflux  disease)    Hyperlipidemia    Hypertension    Low back pain    PONV (postoperative nausea and vomiting)    Sacroiliac pain     Past Surgical History:  Procedure Laterality Date   COLONOSCOPY     never as of 12/2015   FOOT SURGERY     JOINT REPLACEMENT  2007, [id number]   WRIST , KNEE, FOOT   KNEE SURGERY     bilateral   WRIST SURGERY      Current Facility-Administered Medications  Medication Dose Route Frequency Provider Last Rate Last Admin   ceFAZolin (ANCEF) IVPB 2g/100 mL premix  2 g Intravenous On Call to OR Pete Pelt, PA-C       lactated ringers infusion   Intravenous Continuous Janeece Riggers, MD 10 mL/hr at 08/17/22 0612 Continued from Pre-op at 08/17/22 0612   tranexamic acid (CYKLOKAPRON) IVPB 1,000 mg  1,000 mg Intravenous To OR Pete Pelt, PA-C       Facility-Administered Medications Ordered in Other Encounters  Medication Dose Route Frequency Provider Last Rate Last Admin   fentaNYL (SUBLIMAZE) injection   Intravenous Anesthesia Intra-op Key, Kristopher, CRNA   50 mcg at 08/17/22 0649   midazolam (VERSED) 5 MG/5ML injection   Intravenous Anesthesia Intra-op Key, Kristopher, CRNA   1 mg at 08/17/22 0649   ondansetron (ZOFRAN) injection   Intravenous Anesthesia Intra-op Key, Kristopher, CRNA   4 mg at 08/17/22 0649   ropivacaine (PF) 5 mg/mL (0.5%) (NAROPIN) injection   Peri-NEURAL Anesthesia Intra-op Lynda Rainwater, MD   20 mL at 08/17/22 0657   Allergies  Allergen Reactions   Codeine Nausea And Vomiting   Percocet [Oxycodone-Acetaminophen] Nausea Only  Social History   Tobacco Use   Smoking status: Never   Smokeless tobacco: Never  Substance Use Topics   Alcohol use: Yes    Comment: socially    Family History  Problem Relation Age of Onset   Asthma Mother    Arthritis Mother    Diabetes Maternal Aunt      Review of Systems  Musculoskeletal:  Positive for gait problem and joint swelling.  All other systems reviewed and are  negative.   Objective:  Physical Exam Vitals reviewed.  Constitutional:      Appearance: Normal appearance. He is normal weight.  HENT:     Head: Normocephalic and atraumatic.  Eyes:     Extraocular Movements: Extraocular movements intact.     Pupils: Pupils are equal, round, and reactive to light.  Cardiovascular:     Rate and Rhythm: Normal rate and regular rhythm.     Pulses: Normal pulses.  Pulmonary:     Breath sounds: Normal breath sounds.  Abdominal:     Palpations: Abdomen is soft.  Musculoskeletal:     Cervical back: Normal range of motion.     Left knee: Effusion, bony tenderness and crepitus present. Decreased range of motion. Tenderness present over the medial joint line and lateral joint line. Abnormal alignment.  Neurological:     Mental Status: He is alert and oriented to person, place, and time.  Psychiatric:        Behavior: Behavior normal.     Vital signs in last 24 hours: Temp:  [98.3 F (36.8 C)] 98.3 F (36.8 C) (10/26 0624) Pulse Rate:  [83] 83 (10/26 0624) Resp:  [16] 16 (10/26 0624) BP: (182)/(79) 182/79 (10/26 0624) SpO2:  [99 %] 99 % (10/26 0624) Weight:  [81 kg] 81 kg (10/26 0551)  Labs:   Estimated body mass index is 23.56 kg/m as calculated from the following:   Height as of this encounter: 6\' 1"  (1.854 m).   Weight as of this encounter: 81 kg.   Imaging Review Plain radiographs demonstrate severe degenerative joint disease of the left knee(s). The overall alignment ismild varus. The bone quality appears to be good for age and reported activity level.      Assessment/Plan:  End stage arthritis, left knee   The patient history, physical examination, clinical judgment of the provider and imaging studies are consistent with end stage degenerative joint disease of the left knee(s) and total knee arthroplasty is deemed medically necessary. The treatment options including medical management, injection therapy arthroscopy and  arthroplasty were discussed at length. The risks and benefits of total knee arthroplasty were presented and reviewed. The risks due to aseptic loosening, infection, stiffness, patella tracking problems, thromboembolic complications and other imponderables were discussed. The patient acknowledged the explanation, agreed to proceed with the plan and consent was signed. Patient is being admitted for inpatient treatment for surgery, pain control, PT, OT, prophylactic antibiotics, VTE prophylaxis, progressive ambulation and ADL's and discharge planning. The patient is planning to be discharged home with home health services

## 2022-08-17 NOTE — Anesthesia Procedure Notes (Signed)
Spinal  Patient location during procedure: OR Start time: 08/17/2022 7:20 AM End time: 08/17/2022 7:25 AM Reason for block: surgical anesthesia Staffing Performed: anesthesiologist  Anesthesiologist: Lynda Rainwater, MD Performed by: Lynda Rainwater, MD Authorized by: Lynda Rainwater, MD   Preanesthetic Checklist Completed: patient identified, IV checked, site marked, risks and benefits discussed, surgical consent, monitors and equipment checked, pre-op evaluation and timeout performed Spinal Block Patient position: sitting Prep: DuraPrep Patient monitoring: heart rate, cardiac monitor, continuous pulse ox and blood pressure Approach: midline Location: L3-4 Injection technique: single-shot Needle Needle type: Quincke  Needle gauge: 22 G Needle length: 9 cm Assessment Sensory level: T4 Events: CSF return

## 2022-08-17 NOTE — Discharge Instructions (Signed)

## 2022-08-18 ENCOUNTER — Encounter (HOSPITAL_COMMUNITY): Payer: Self-pay | Admitting: Orthopaedic Surgery

## 2022-08-18 ENCOUNTER — Telehealth: Payer: Self-pay | Admitting: Orthopaedic Surgery

## 2022-08-18 ENCOUNTER — Telehealth: Payer: Self-pay

## 2022-08-18 ENCOUNTER — Other Ambulatory Visit: Payer: Self-pay | Admitting: Physician Assistant

## 2022-08-18 DIAGNOSIS — M1712 Unilateral primary osteoarthritis, left knee: Secondary | ICD-10-CM | POA: Diagnosis not present

## 2022-08-18 LAB — BASIC METABOLIC PANEL
Anion gap: 8 (ref 5–15)
BUN: 14 mg/dL (ref 8–23)
CO2: 25 mmol/L (ref 22–32)
Calcium: 8.6 mg/dL — ABNORMAL LOW (ref 8.9–10.3)
Chloride: 103 mmol/L (ref 98–111)
Creatinine, Ser: 0.93 mg/dL (ref 0.61–1.24)
GFR, Estimated: >60 mL/min (ref >60)
Glucose, Bld: 139 mg/dL — ABNORMAL HIGH (ref 70–99)
Potassium: 4 mmol/L (ref 3.5–5.1)
Sodium: 136 mmol/L (ref 135–145)

## 2022-08-18 LAB — CBC
HCT: 29.7 % — ABNORMAL LOW (ref 39.0–52.0)
Hemoglobin: 10.5 g/dL — ABNORMAL LOW (ref 13.0–17.0)
MCH: 32.3 pg (ref 26.0–34.0)
MCHC: 35.4 g/dL (ref 30.0–36.0)
MCV: 91.4 fL (ref 80.0–100.0)
Platelets: 170 10*3/uL (ref 150–400)
RBC: 3.25 MIL/uL — ABNORMAL LOW (ref 4.22–5.81)
RDW: 13.2 % (ref 11.5–15.5)
WBC: 15.6 10*3/uL — ABNORMAL HIGH (ref 4.0–10.5)
nRBC: 0 % (ref 0.0–0.2)

## 2022-08-18 MED ORDER — HYDROMORPHONE HCL 4 MG PO TABS: 4.0000 mg | ORAL_TABLET | ORAL | 0 refills | Status: DC | PRN

## 2022-08-18 MED ORDER — ASPIRIN 81 MG PO CHEW: 81.0000 mg | CHEWABLE_TABLET | Freq: Two times a day (BID) | ORAL | 0 refills | Status: AC

## 2022-08-18 MED ORDER — ONDANSETRON 4 MG PO TBDP: 4.0000 mg | ORAL_TABLET | Freq: Three times a day (TID) | ORAL | 0 refills | Status: AC | PRN

## 2022-08-18 MED ORDER — METHOCARBAMOL 500 MG PO TABS: 500.0000 mg | ORAL_TABLET | Freq: Four times a day (QID) | ORAL | 1 refills | Status: DC | PRN

## 2022-08-18 NOTE — Progress Notes (Signed)
Physical Therapy Treatment Patient Details Name: [name] MRN: [id number] DOB: [birth year] Today's Date: 08/18/2022   History of Present Illness Pt s/p L TKR    PT Comments    Pt continues to progress well with mobility.  Pt up to ambulate in hall progressing to reciprocal gait, negotiated stairs, reviewed written HEP and reviewed don/doff KI.  Pt eager for dc home this date.   Recommendations for follow up therapy are one component of a multi-disciplinary discharge planning process, led by the attending physician.  Recommendations may be updated based on patient status, additional functional criteria and insurance authorization.  Follow Up Recommendations  Follow physician's recommendations for discharge plan and follow up therapies     Assistance Recommended at Discharge Intermittent Supervision/Assistance  Patient can return home with the following A little help with walking and/or transfers;A little help with bathing/dressing/bathroom;Assist for transportation;Help with stairs or ramp for entrance;Assistance with cooking/housework   Equipment Recommendations  Rolling walker (2 wheels)    Recommendations for Other Services       Precautions / Restrictions Precautions Precautions: Knee;Fall Restrictions Weight Bearing Restrictions: No LLE Weight Bearing: Weight bearing as tolerated     Mobility  Bed Mobility Overal bed mobility: Needs Assistance Bed Mobility: Supine to Sit     Supine to sit: Supervision     General bed mobility comments: Pt up in chair and requests back to same    Transfers Overall transfer level: Needs assistance Equipment used: Rolling walker (2 wheels) Transfers: Sit to/from Stand Sit to Stand: Supervision           General transfer comment: cues for LE management and use of UEs to self assist    Ambulation/Gait Ambulation/Gait assistance: Min guard, Supervision Gait Distance (Feet): 200 Feet Assistive device: Rolling walker  (2 wheels) Gait Pattern/deviations: Decreased step length - right, Decreased step length - left, Shuffle, Trunk flexed, Step-to pattern, Step-through pattern Gait velocity: decr     General Gait Details: cues for sequence, posture and position from RW;   Stairs Stairs: Yes Stairs assistance: Min assist Stair Management: No rails, Step to pattern, Forwards, With walker Number of Stairs: 2 General stair comments: single step twice with RW and cues for sequence   Wheelchair Mobility    Modified Rankin (Stroke Patients Only)       Balance Overall balance assessment: Mild deficits observed, not formally tested                                          Cognition Arousal/Alertness: Awake/alert Behavior During Therapy: WFL for tasks assessed/performed Overall Cognitive Status: Within Functional Limits for tasks assessed                                          Exercises Total Joint Exercises Ankle Circles/Pumps: AROM, Both, 15 reps, Supine Quad Sets: AROM, Both, 10 reps, Supine Heel Slides: AAROM, Left, 15 reps, Supine Straight Leg Raises: AAROM, Left, 10 reps, Supine    General Comments        Pertinent Vitals/Pain Pain Assessment Pain Assessment: 0-10 Pain Score: 3  Pain Location: L knee Pain Descriptors / Indicators: Aching, Burning, Sore Pain Intervention(s): Limited activity within patient's tolerance    Home Living  Prior Function            PT Goals (current goals can now be found in the care plan section) Acute Rehab PT Goals Patient Stated Goal: Regain IND PT Goal Formulation: With patient Time For Goal Achievement: 08/24/22 Potential to Achieve Goals: Good Progress towards PT goals: Progressing toward goals    Frequency    7X/week      PT Plan Current plan remains appropriate    Co-evaluation              AM-PAC PT "6 Clicks" Mobility   Outcome Measure  Help  needed turning from your back to your side while in a flat bed without using bedrails?: A Little Help needed moving from lying on your back to sitting on the side of a flat bed without using bedrails?: A Little Help needed moving to and from a bed to a chair (including a wheelchair)?: A Little Help needed standing up from a chair using your arms (e.g., wheelchair or bedside chair)?: A Little Help needed to walk in hospital room?: A Little Help needed climbing 3-5 steps with a railing? : A Little 6 Click Score: 18    End of Session Equipment Utilized During Treatment: Gait belt;Left knee immobilizer Activity Tolerance: Patient tolerated treatment well Patient left: in chair;with call bell/phone within reach;with chair alarm set;with family/visitor present Nurse Communication: Mobility status PT Visit Diagnosis: Difficulty in walking, not elsewhere classified (R26.2)     Time: 4742-5956 PT Time Calculation (min) (ACUTE ONLY): 26 min  Charges:  $Gait Training: 8-22 mins $Therapeutic Exercise: 8-22 mins $Therapeutic Activity: 8-22 mins                     Mauro Kaufmann PT Acute Rehabilitation Services Pager [phone] Office [phone]    Taesha Goodell 08/18/2022, 12:08 PM

## 2022-08-18 NOTE — Telephone Encounter (Signed)
Pt called in stating medication (HYDROmorphone (DILAUDID) tablet 2-3 mg) is not available at his local pharmacy.... Pt is requesting for medication (HYDROmorphone (DILAUDID) tablet 2-3 mg) [facility] [place] [facility] [address]. Pt requesting callback at [phone]

## 2022-08-18 NOTE — TOC Transition Note (Signed)
Transition of Care [facility]) - CM/SW Discharge Note   Patient Details  Name: [name] MRN: [id number] Date of Birth: 10-Sep-1961  Transition of Care [facility]) CM/SW Contact:  Lennart Pall, LCSW Phone Number: 08/18/2022, 10:12 AM   Clinical Narrative:    Met with pt who confirms need for rolling walker/ no DME agency preference - order placed with [employer] and delivered to room.  HHPT prearranged with [facility].  No further TOC needs.   Final next level of care: [place] Barriers to Discharge: No Barriers Identified   Patient Goals and CMS Choice Patient states their goals for this hospitalization and ongoing recovery are:: return home      Discharge Placement                       Discharge Plan and Services                DME Arranged: Walker rolling DME Agency: [employer] Date DME Agency Contacted: 08/18/22 Time DME Agency Contacted: [phone] Representative spoke with at DME Agency: Ovid Curd HH Arranged: PT [place]: [place]        Social Determinants of Health (SDOH) Interventions     Readmission Risk Interventions     No data to display

## 2022-08-18 NOTE — Telephone Encounter (Signed)
Patient aware this was called in  

## 2022-08-18 NOTE — Telephone Encounter (Signed)
Pt's wife Arville Go asked if hydromorphone to be sent to [facility] [address] on file is out of medication. Please call pt at [phone].

## 2022-08-18 NOTE — Telephone Encounter (Signed)
Nurse from [facility] called and states that this pt is set to d/c however the hydromorphone is not available at the [facility]. [facility] [place]

## 2022-08-18 NOTE — Progress Notes (Signed)
Discharge package printed and instructions given to patient. Verbalizes understanding.  

## 2022-08-18 NOTE — Progress Notes (Signed)
Subjective: 1 Day Post-Op Procedure(s) (LRB): LEFT TOTAL KNEE ARTHROPLASTY (Left) Patient reports pain as moderate.    Objective: Vital signs in last 24 hours: Temp:  [97.5 F (36.4 C)-98.4 F (36.9 C)] 97.9 F (36.6 C) (10/27 0627) Pulse Rate:  [65-85] 79 (10/27 0627) Resp:  [15-19] 18 (10/27 0627) BP: (118-156)/(64-79) 154/74 (10/27 0627) SpO2:  [86 %-97 %] 96 % (10/27 0627)  Intake/Output from previous day: 10/26 0701 - 10/27 0700 In: 4005.9 [P.O.:1220; I.V.:2630.9; IV Piggyback:155] Out: 2875 [Urine:2800; Blood:75] Intake/Output this shift: No intake/output data recorded.  Recent Labs    08/18/22 0328  HGB 10.5*   Recent Labs    08/18/22 0328  WBC 15.6*  RBC 3.25*  HCT 29.7*  PLT 170   Recent Labs    08/18/22 0328  NA 136  K 4.0  CL 103  CO2 25  BUN 14  CREATININE 0.93  GLUCOSE 139*  CALCIUM 8.6*   No results for input(s): "LABPT", "INR" in the last 72 hours.  Sensation intact distally Intact pulses distally Dorsiflexion/Plantar flexion intact Incision: dressing C/D/I No cellulitis present Compartment soft   Assessment/Plan: 1 Day Post-Op Procedure(s) (LRB): LEFT TOTAL KNEE ARTHROPLASTY (Left) Up with therapy Discharge home with home health      Mcarthur Rossetti 08/18/2022, 7:24 AM

## 2022-08-18 NOTE — Progress Notes (Signed)
Called [facility] on HYDROmorphone issue with [facility], need to [facility] in to [facility] [place].  Receptionist will send msg to PA so it will be resolved.

## 2022-08-18 NOTE — Discharge Summary (Signed)
Patient ID: [name] MRN: [id number] DOB/AGE: May 11, 1961 61 y.o.  Admit date: 08/17/2022 Discharge date: 08/18/2022  Admission Diagnoses:  Principal Problem:   Unilateral primary osteoarthritis, left knee Active Problems:   Status post total left knee replacement   Discharge Diagnoses:  Same  Past Medical History:  Diagnosis Date   Arthritis    Complication of anesthesia    Fatty liver disease, nonalcoholic    GERD (gastroesophageal reflux disease)    Hyperlipidemia    Hypertension    Low back pain    PONV (postoperative nausea and vomiting)    Sacroiliac pain     Surgeries: Procedure(s): LEFT TOTAL KNEE ARTHROPLASTY on 08/17/2022   Consultants:   Discharged Condition: Improved  Hospital Course: [name] is an 61 y.o. male who was admitted 08/17/2022 for operative treatment ofUnilateral primary osteoarthritis, left knee. Patient has severe unremitting pain that affects sleep, daily activities, and work/hobbies. After pre-op clearance the patient was taken to the operating room on 08/17/2022 and underwent  Procedure(s): LEFT TOTAL KNEE ARTHROPLASTY.    Patient was given perioperative antibiotics:  Anti-infectives (From admission, onward)    Start     Dose/Rate Route Frequency Ordered Stop   08/17/22 1330  ceFAZolin (ANCEF) IVPB 1 g/50 mL premix        1 g 100 mL/hr over 30 Minutes Intravenous Every 6 hours 08/17/22 1048 08/17/22 2026   08/17/22 0600  ceFAZolin (ANCEF) IVPB 2g/100 mL premix        2 g 200 mL/hr over 30 Minutes Intravenous On call to O.R. 08/17/22 8182 08/17/22 9937        Patient was given sequential compression devices, early ambulation, and chemoprophylaxis to prevent DVT.  Patient benefited maximally from hospital stay and there were no complications.    Recent vital signs: Patient Vitals for the past 24 hrs:  BP Temp Temp src Pulse Resp SpO2  08/18/22 0627 (!) 154/74 97.9 F (36.6 C) Oral 79 18 96 %  08/18/22 0133 (!)  142/71 98.1 F (36.7 C) Oral 85 18 96 %  08/17/22 1953 (!) 156/79 98.4 F (36.9 C) Oral 79 18 94 %  08/17/22 1830 (!) 148/72 98.4 F (36.9 C) Oral 85 17 96 %  08/17/22 1336 118/64 -- -- 65 -- 97 %     Recent laboratory studies:  Recent Labs    08/18/22 0328  WBC 15.6*  HGB 10.5*  HCT 29.7*  PLT 170  NA 136  K 4.0  CL 103  CO2 25  BUN 14  CREATININE 0.93  GLUCOSE 139*  CALCIUM 8.6*     Discharge Medications:   Allergies as of 08/18/2022       Reactions   Codeine Nausea And Vomiting   Percocet [oxycodone-acetaminophen] Nausea Only        Medication List     TAKE these medications    aspirin 81 MG chewable tablet Chew 1 tablet (81 mg total) by mouth 2 (two) times daily.   HYDROmorphone 4 MG tablet Commonly known as: Dilaudid Take 1 tablet (4 mg total) by mouth every 4 (four) hours as needed for severe pain.   lisinopril 40 MG tablet Commonly known as: ZESTRIL Take 40 mg by mouth at bedtime.   methocarbamol 500 MG tablet Commonly known as: ROBAXIN Take 1 tablet (500 mg total) by mouth every 6 (six) hours as needed for muscle spasms.   multivitamin with minerals tablet Take 1 tablet by mouth daily. Centrum A-Zinc   omeprazole  40 MG capsule Commonly known as: PRILOSEC Take 40 mg by mouth at bedtime.   ondansetron 4 MG disintegrating tablet Commonly known as: ZOFRAN-ODT Take 1 tablet (4 mg total) by mouth every 8 (eight) hours as needed for nausea or vomiting.   rosuvastatin 10 MG tablet Commonly known as: CRESTOR Take 10 mg by mouth at bedtime.               Durable Medical Equipment  (From admission, onward)           Start     Ordered   08/17/22 1049  DME 3 n 1  Once        08/17/22 1048   08/17/22 1049  DME Walker rolling  Once       Question Answer Comment  Walker: With 5 Inch Wheels   Patient needs a walker to treat with the following condition Status post total left knee replacement      08/17/22 1048             Diagnostic Studies: DG Knee Left Port  Result Date: 08/17/2022 CLINICAL DATA:  Status post total left knee arthroplasty. EXAM: PORTABLE LEFT KNEE - 1-2 VIEW COMPARISON:  Left knee radiographs 07/19/2022 FINDINGS: Interval total left knee arthroplasty. Redemonstration of proximal tibial screw from prior remote ACL reconstruction. The prior distal femoral ACL surgery screw has been removed. No perihardware lucency is seen to indicate hardware failure or loosening. Expected moderate joint effusion and intra-articular and subcutaneous postoperative air. Anterior surgical skin staples. Moderate chronic enthesopathic change at the quadriceps and patellar insertions on the patella. No acute fracture or dislocation. IMPRESSION: Interval total left knee arthroplasty without evidence of hardware failure. Electronically Signed   By: Neita Garnet M.D.   On: 08/17/2022 10:15    Disposition: Discharge disposition: 01-Home or Self Care          Follow-up Information     Kathryne Hitch, MD Follow up in 2 week(s).   Specialty: Orthopedic Surgery Contact information: [address] [place] [address] [phone]                  Signed: Kathryne Hitch 08/18/2022, 1:14 PM

## 2022-08-18 NOTE — Progress Notes (Signed)
Physical Therapy Treatment Patient Details Name: [name] MRN: [id number] DOB: 1960-12-26 Today's Date: 08/18/2022   History of Present Illness Pt s/p L TKR    PT Comments    Pt very motivated and progressing well with mobility - no c/o dizziness or nausea this am.  Pt up to ambulate increased distance in hall and HEP initiated.  Pt should progress to dc home this date.  Recommendations for follow up therapy are one component of a multi-disciplinary discharge planning process, led by the attending physician.  Recommendations may be updated based on patient status, additional functional criteria and insurance authorization.  Follow Up Recommendations  Follow physician's recommendations for discharge plan and follow up therapies     Assistance Recommended at Discharge Intermittent Supervision/Assistance  Patient can return home with the following A little help with walking and/or transfers;A little help with bathing/dressing/bathroom;Assist for transportation;Help with stairs or ramp for entrance;Assistance with cooking/housework   Equipment Recommendations  Rolling walker (2 wheels)    Recommendations for Other Services       Precautions / Restrictions Precautions Precautions: Knee;Fall Restrictions Weight Bearing Restrictions: No LLE Weight Bearing: Weight bearing as tolerated     Mobility  Bed Mobility Overal bed mobility: Needs Assistance Bed Mobility: Supine to Sit     Supine to sit: Supervision     General bed mobility comments: cues for sequence with pt self assisting L LE with gait belt    Transfers Overall transfer level: Needs assistance Equipment used: Rolling walker (2 wheels) Transfers: Sit to/from Stand Sit to Stand: Min guard, Supervision           General transfer comment: cues for LE management and use of UEs to self assist    Ambulation/Gait Ambulation/Gait assistance: Min guard Gait Distance (Feet): 140 Feet Assistive device:  Rolling walker (2 wheels) Gait Pattern/deviations: Step-to pattern, Decreased step length - right, Decreased step length - left, Shuffle, Trunk flexed Gait velocity: decr     General Gait Details: cues for sequence, posture and position from RW;   Chief Strategy Officer    Modified Rankin (Stroke Patients Only)       Balance Overall balance assessment: Mild deficits observed, not formally tested                                          Cognition Arousal/Alertness: Awake/alert Behavior During Therapy: WFL for tasks assessed/performed Overall Cognitive Status: Within Functional Limits for tasks assessed                                          Exercises Total Joint Exercises Ankle Circles/Pumps: AROM, Both, 15 reps, Supine Quad Sets: AROM, Both, 10 reps, Supine Heel Slides: AAROM, Left, 15 reps, Supine Straight Leg Raises: AAROM, Left, 10 reps, Supine    General Comments        Pertinent Vitals/Pain Pain Assessment Pain Assessment: 0-10 Pain Score: 4  Pain Location: L knee Pain Descriptors / Indicators: Aching, Burning, Sore Pain Intervention(s): Limited activity within patient's tolerance, Monitored during session, Premedicated before session, Ice applied    Home Living  Prior Function            PT Goals (current goals can now be found in the care plan section) Acute Rehab PT Goals Patient Stated Goal: Regain IND PT Goal Formulation: With patient Time For Goal Achievement: 08/24/22 Potential to Achieve Goals: Good Progress towards PT goals: Progressing toward goals    Frequency    7X/week      PT Plan Current plan remains appropriate    Co-evaluation              AM-PAC PT "6 Clicks" Mobility   Outcome Measure  Help needed turning from your back to your side while in a flat bed without using bedrails?: A Little Help needed moving from lying on  your back to sitting on the side of a flat bed without using bedrails?: A Little Help needed moving to and from a bed to a chair (including a wheelchair)?: A Little Help needed standing up from a chair using your arms (e.g., wheelchair or bedside chair)?: A Little Help needed to walk in hospital room?: A Little Help needed climbing 3-5 steps with a railing? : A Lot 6 Click Score: 17    End of Session Equipment Utilized During Treatment: Gait belt;Left knee immobilizer Activity Tolerance: Patient tolerated treatment well Patient left: in chair;with call bell/phone within reach;with chair alarm set;with family/visitor present Nurse Communication: Mobility status PT Visit Diagnosis: Difficulty in walking, not elsewhere classified (R26.2)     Time: 1610-9604 PT Time Calculation (min) (ACUTE ONLY): 37 min  Charges:  $Gait Training: 8-22 mins $Therapeutic Exercise: 8-22 mins                     Mauro Kaufmann PT Acute Rehabilitation Services Pager [phone] Office [phone]    Stein Windhorst 08/18/2022, 12:02 PM

## 2022-08-18 NOTE — Plan of Care (Signed)
  Problem: Activity: Goal: Ability to avoid complications of mobility impairment will improve 08/18/2022 0718 by Mayme Genta, RN Outcome: Progressing 08/18/2022 0716 by Mayme Genta, RN Outcome: Progressing Goal: Range of joint motion will improve 08/18/2022 0718 by Mayme Genta, RN Outcome: Progressing 08/18/2022 0716 by Mayme Genta, RN Outcome: Progressing   Problem: Pain Management: Goal: Pain level will decrease with appropriate interventions 08/18/2022 0718 by Mayme Genta, RN Outcome: Progressing 08/18/2022 0716 by Mayme Genta, RN Outcome: Progressing

## 2022-08-21 NOTE — Telephone Encounter (Signed)
Patient called in stating he has not been able to eat since surgery, has had a consistent headache and states he can feel his heartbeat throbbing. He has been feeling sick and did have a fever. He has been doing his PT and can walk to the bathroom and can move his leg up and down. He would like a call to see if some of these things are normal.

## 2022-08-22 ENCOUNTER — Telehealth: Payer: Self-pay | Admitting: Physician Assistant

## 2022-08-22 ENCOUNTER — Telehealth: Payer: Self-pay | Admitting: Orthopaedic Surgery

## 2022-08-22 NOTE — Telephone Encounter (Signed)
Patient called asked if his paperwork was complete and faxed already? Patient asked for a call back letting him know when ir was faxed. The number to contact patient is [phone]

## 2022-08-22 NOTE — Telephone Encounter (Signed)
Pt called requesting a call from [place]. Pt states he was told PA Carlis Abbott would call him to see how he is doing. Please call pt at [phone].

## 2022-08-28 ENCOUNTER — Telehealth: Payer: Self-pay | Admitting: Orthopaedic Surgery

## 2022-08-28 NOTE — Telephone Encounter (Signed)
Pt called stating he has medical question and having a problem with one of his medication. Please call pt at [phone].

## 2022-08-28 NOTE — Telephone Encounter (Signed)
Patient states Oxycodone and Robaxin are making him nauseous, has to take nausea medication each time. I told him to not take them together anymore and see if that helps and to call me back if it does not

## 2022-08-31 ENCOUNTER — Encounter: Payer: Self-pay | Admitting: Orthopaedic Surgery

## 2022-08-31 ENCOUNTER — Ambulatory Visit (INDEPENDENT_AMBULATORY_CARE_PROVIDER_SITE_OTHER): Payer: BC Managed Care – PPO | Admitting: Orthopaedic Surgery

## 2022-08-31 DIAGNOSIS — Z96652 Presence of left artificial knee joint: Secondary | ICD-10-CM

## 2022-08-31 MED ORDER — TRAMADOL HCL 50 MG PO TABS: 50.0000 mg | ORAL_TABLET | Freq: Four times a day (QID) | ORAL | 0 refills | Status: AC | PRN

## 2022-08-31 NOTE — Progress Notes (Signed)
The patient is here today for his first postoperative visit status post a left total knee arthroplasty.  He has been making progress at home.  Has been compliant with a baby aspirin twice a day.  He has been on hydromorphone for pain and said at this point he can try something less strong even considering tramadol.  He has had home therapy and we need to set him up for outpatient physical therapy with [facility] in [place].  His calf is soft on the left side.  He does have knee swelling to be expected on that left knee.  This incision looks good.  The staples are removed and Steri-Strips applied.  His extension is almost full and I can flex him to about 80 degrees.  He understands the importance of increasing motion is much as possible with that knee.  I will send in some tramadol for him and gave him a prescription for physical therapy.  We will see him back in 4 weeks to see how he is doing overall from a mobility and motion standpoint.

## 2022-09-01 ENCOUNTER — Other Ambulatory Visit: Payer: Self-pay | Admitting: Orthopaedic Surgery

## 2022-09-01 ENCOUNTER — Telehealth: Payer: Self-pay

## 2022-09-01 MED ORDER — HYDROCODONE-ACETAMINOPHEN 5-325 MG PO TABS: 1.0000 | ORAL_TABLET | Freq: Four times a day (QID) | ORAL | 0 refills | Status: AC | PRN

## 2022-09-01 NOTE — Telephone Encounter (Signed)
Patient called triage phone stated that his pain medication is causing him constipation and wishes to have his medication changed to something else.  [phone]

## 2022-09-04 ENCOUNTER — Telehealth: Payer: Self-pay | Admitting: Orthopaedic Surgery

## 2022-09-04 NOTE — Telephone Encounter (Signed)
Patient called again regarding message below stating that he is having pain at times around his knee cap

## 2022-09-04 NOTE — Telephone Encounter (Signed)
Patient called asked if Dr. Magnus Ivan would call him?  Patient said when he walks or try to bend his knee it hurts below the knee cap and he is hearing a popping sound. Patient said he is concerned about the popping noise and want to know if it will stop. The number to contact patient is [phone]

## 2022-09-20 ENCOUNTER — Telehealth: Payer: Self-pay | Admitting: Orthopaedic Surgery

## 2022-09-20 NOTE — Telephone Encounter (Signed)
Pt called and would like to speak with [name] if possible about his knee he just had surgery on. Pt would not state specific questions but nothing concerning he says.

## 2022-09-23 ENCOUNTER — Other Ambulatory Visit: Payer: Self-pay | Admitting: Orthopaedic Surgery

## 2022-09-23 MED ORDER — CYCLOBENZAPRINE HCL 10 MG PO TABS: 10.0000 mg | ORAL_TABLET | Freq: Three times a day (TID) | ORAL | 1 refills | Status: AC | PRN

## 2022-09-28 ENCOUNTER — Ambulatory Visit (INDEPENDENT_AMBULATORY_CARE_PROVIDER_SITE_OTHER): Payer: BC Managed Care – PPO | Admitting: Orthopaedic Surgery

## 2022-09-28 ENCOUNTER — Encounter: Payer: Self-pay | Admitting: Orthopaedic Surgery

## 2022-09-28 DIAGNOSIS — Z96652 Presence of left artificial knee joint: Secondary | ICD-10-CM

## 2022-09-28 NOTE — Progress Notes (Signed)
The patient is now 6 weeks status post a left total knee arthroplasty.  He said the first month was awful but now he is turned the corner for the better.  He is off of pain medication.  He is [age] years old.  He states he still feels like the kneecap is popping and grinding.  On exam he lacks full extension by about 3 degrees.  There is moderate swelling to be expected.  His patella is mobile.  He has excellent range of motion of his knee and he is really pushed himself hard with his flexion.  The knee feels limply stable.  I gave him assurance that with swelling decreasing and quad strengthening that his knee will feel better with time but already he is ahead of most people.  I would like to see him back in 6 weeks and at that visit would like a standing AP and lateral of his left operative knee.  All questions and concerns were answered and addressed.

## 2022-10-13 ENCOUNTER — Telehealth: Payer: Self-pay | Admitting: Orthopaedic Surgery

## 2022-10-13 NOTE — Telephone Encounter (Signed)
LMOM for patient that Dr.Blackman was out of town and if he still had questions he could call back

## 2022-10-13 NOTE — Telephone Encounter (Signed)
Patient need to speak to dr. Magnus Ivan would not advise the question he has..[phone]

## 2022-10-18 ENCOUNTER — Ambulatory Visit (INDEPENDENT_AMBULATORY_CARE_PROVIDER_SITE_OTHER): Payer: BC Managed Care – PPO

## 2022-10-18 ENCOUNTER — Encounter: Payer: Self-pay | Admitting: Orthopaedic Surgery

## 2022-10-18 ENCOUNTER — Ambulatory Visit (INDEPENDENT_AMBULATORY_CARE_PROVIDER_SITE_OTHER): Payer: BC Managed Care – PPO | Admitting: Orthopaedic Surgery

## 2022-10-18 ENCOUNTER — Ambulatory Visit (HOSPITAL_COMMUNITY)
Admission: RE | Admit: 2022-10-18 | Discharge: 2022-10-18 | Disposition: A | Discharge status: Home / Self Care | Payer: BC Managed Care – PPO | Source: Ambulatory Visit | Attending: Orthopaedic Surgery | Admitting: Orthopaedic Surgery

## 2022-10-18 DIAGNOSIS — Z96652 Presence of left artificial knee joint: Secondary | ICD-10-CM

## 2022-10-18 NOTE — Progress Notes (Signed)
The patient is now 2 months status post a left total knee arthroplasty.  He is 61 years old.  He comes in today wearing a appropriately about the swelling in his left operative knee as well as foot and ankle swelling.  He says it was really swollen over the weekend and he talked to one of our partners who gave him some recommendations.  There is no swelling in the foot and ankle today.  However he is concerned about warmth of his knee.  He is walking without an assistive device.  He has been pushing himself hard to physical therapy.  On examination his left knee has appropriate swelling that we see at 2 months after surgery.  The warmth is from the swelling in the knee.  There is no redness at all.  He has excellent range of motion of that knee and there is no instability on exam.  His calf is soft and there is no foot and ankle swelling.  However given the concern that he does have with foot and ankle swelling, it is appropriate to obtain a Doppler ultrasound of the left lower extremity to rule out a DVT.  X-rays today of his left knee are shared with him and it shows a well-seated total knee arthroplasty with no complicating features.  I gave him reassurance that the swelling he has is normal in terms of the knee joint itself at 2 months.  We will send him for an ultrasound and give him the results once we determine whether or not he has a blood clot.  He will continue his therapy as well and I gave him reassurance that he is doing an excellent job from his standpoint and mind in terms of getting that knee moving and strong.

## 2022-11-08 ENCOUNTER — Encounter: Payer: Self-pay | Admitting: Orthopaedic Surgery

## 2022-11-08 ENCOUNTER — Ambulatory Visit (INDEPENDENT_AMBULATORY_CARE_PROVIDER_SITE_OTHER): Payer: BC Managed Care – PPO | Admitting: Orthopaedic Surgery

## 2022-11-08 DIAGNOSIS — Z96652 Presence of left artificial knee joint: Secondary | ICD-10-CM

## 2022-11-08 NOTE — Progress Notes (Signed)
The patient is about 10 weeks status post a left total knee arthroplasty.  He is now just transition to a home exercise program.  He is doing great.  He is an active 62 year old gentleman.  On exam his knee is still swollen to be expected but he has excellent range of motion.  The knee is ligamentously stable as well.  From my standpoint we will see him back in 3 months with a standing AP and lateral of his left operative knee.  All questions and concerns were answered and addressed.  He is already off of pain medications as well.  If he does have any issues he knows to let us know.

## 2022-11-20 ENCOUNTER — Telehealth: Payer: Self-pay | Admitting: Orthopaedic Surgery

## 2022-11-20 NOTE — Telephone Encounter (Signed)
Pt called requesting a call from Dr Ninfa Linden. Pt states he has a medical question but did not specify question. Please call pt at [phone].

## 2022-11-20 NOTE — Telephone Encounter (Signed)
I called the pt. He stated he is having a painful popping on "left side of knee" He stated he wants to discuss this with you. I made him an appt for 2/14 but he wants to talk to you still. Please cb when able

## 2022-12-06 ENCOUNTER — Ambulatory Visit: Payer: BC Managed Care – PPO | Admitting: Orthopaedic Surgery

## 2023-02-08 ENCOUNTER — Ambulatory Visit (INDEPENDENT_AMBULATORY_CARE_PROVIDER_SITE_OTHER): Payer: BC Managed Care – PPO | Admitting: Orthopaedic Surgery

## 2023-02-08 ENCOUNTER — Ambulatory Visit: Payer: BC Managed Care – PPO | Admitting: Orthopaedic Surgery

## 2023-02-08 ENCOUNTER — Other Ambulatory Visit (INDEPENDENT_AMBULATORY_CARE_PROVIDER_SITE_OTHER): Payer: BC Managed Care – PPO

## 2023-02-08 ENCOUNTER — Encounter: Payer: Self-pay | Admitting: Orthopaedic Surgery

## 2023-02-08 DIAGNOSIS — Z96652 Presence of left artificial knee joint: Secondary | ICD-10-CM

## 2023-02-08 NOTE — Progress Notes (Signed)
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  He is also a drummer in a band.  We replaced his left knee 6 months ago.  He had had a an ACL reconstruction in his 30s.  We had to place press-fit implants.  His tibial interference screw was buried deep in the bone.  He still has swelling in the knee but he reports good range of motion and strength and he is doing pretty well overall.  On exam he does still have a mild effusion of his left knee.  It is ligamentously stable full range of motion.  He does have irritation around the lateral IT band and a small scar tissue of plica that is symptomatic.  Overall though he looks good.  2 views of the left knee show well-seated press-fit total knee arthroplasty with no complicating features.  I gave him reassurance that he is still doing well for knee replacement surgery and hopefully he will continue to improve.  I would like to see him back in 6 months with a standing AP and lateral of his left knee.  All questions and concerns were addressed and answered.

## 2023-07-05 ENCOUNTER — Ambulatory Visit: Payer: BC Managed Care – PPO | Admitting: Dermatology

## 2023-07-10 ENCOUNTER — Ambulatory Visit (INDEPENDENT_AMBULATORY_CARE_PROVIDER_SITE_OTHER): Payer: BC Managed Care – PPO | Admitting: Dermatology

## 2023-07-10 ENCOUNTER — Encounter: Payer: Self-pay | Admitting: Dermatology

## 2023-07-10 ENCOUNTER — Other Ambulatory Visit: Payer: Self-pay

## 2023-07-10 VITALS — BP 120/75 | HR 77

## 2023-07-10 DIAGNOSIS — C44329 Squamous cell carcinoma of skin of other parts of face: Secondary | ICD-10-CM | POA: Diagnosis not present

## 2023-07-10 DIAGNOSIS — D485 Neoplasm of uncertain behavior of skin: Secondary | ICD-10-CM

## 2023-07-10 DIAGNOSIS — L219 Seborrheic dermatitis, unspecified: Secondary | ICD-10-CM

## 2023-07-10 DIAGNOSIS — L570 Actinic keratosis: Secondary | ICD-10-CM | POA: Diagnosis not present

## 2023-07-10 DIAGNOSIS — C4492 Squamous cell carcinoma of skin, unspecified: Secondary | ICD-10-CM

## 2023-07-10 HISTORY — DX: Squamous cell carcinoma of skin, unspecified: C44.92

## 2023-07-10 MED ORDER — NYSTATIN-TRIAMCINOLONE 100000-0.1 UNIT/GM-% EX OINT: 1.0000 | TOPICAL_OINTMENT | Freq: Two times a day (BID) | CUTANEOUS | 0 refills | Status: AC

## 2023-07-10 MED ORDER — NYSTATIN-TRIAMCINOLONE 100000-0.1 UNIT/GM-% EX OINT: TOPICAL_OINTMENT | CUTANEOUS | 2 refills | Status: AC

## 2023-07-10 NOTE — Progress Notes (Signed)
Pt wanted to change pharmacy

## 2023-07-10 NOTE — Patient Instructions (Addendum)
Dear Mr. Dipaola,  Thank you for visiting my office today. Your dedication to addressing your dermatological health concerns is greatly appreciated. Below is a summary of the key instructions and next steps from today's consultation:  - Biopsy Performed: A shave biopsy was conducted on the suspicious spot on your left cheek. The sample has been sent out for evaluation.  - Cryotherapy Treatment: Approximately 18 actinic keratosis lesions on your scalp were treated with cryotherapy today.  - Post-Treatment Care:   - Application: Apply Aquaphor to the biopsy site and the areas treated with cryotherapy.   - Duration: Continue this care for about two weeks.  - Seborrheic Dermatitis on Eyebrows and Beard Management:   - Medication: Use Nystatin-Triamcinolone cream twice daily.   - Affected Areas: Apply the cream to the nasal crease, eyebrows, and beard area.   - Duration: Use for up to seven days. If the rash clears sooner, discontinue use.   - Caution: Do not use the cream for longer than seven days without a break of at least two weeks.  - Follow-Up Appointment: Please return in February for a reevaluation of the actinic keratoses. Should any lesions remain unresolved, we may consider further treatment with a topical chemotherapy cream called 5-fluorouracil.  Please adhere to these instructions carefully and reach out to our office with any questions or concerns you may have.  Warm regards,  Dr. Langston Reusing [facility]at[facility]  Patient Handout: Wound Care for Skin Biopsy Site  Taking Care of Your Skin Biopsy Site  Proper care of the biopsy site is essential for promoting healing and minimizing scarring. This handout provides instructions on how to care for your biopsy site to ensure optimal recovery.  1. Cleaning the Wound:  Clean the biopsy site daily with gentle soap and water. Gently pat the area dry with a clean, soft towel. Avoid harsh scrubbing or rubbing the area, as this can  irritate the skin and delay healing.  2. Applying Aquaphor and Bandage:  After cleaning the wound, apply a thin layer of Aquaphor ointment to the biopsy site. Cover the area with a sterile bandage to protect it from dirt, bacteria, and friction. Change the bandage daily or as needed if it becomes soiled or wet.  3. Continued Care for One Week:  Repeat the cleaning, Aquaphor application, and bandaging process daily for one week following the biopsy procedure. Keeping the wound clean and moist during this initial healing period will help prevent infection and promote optimal healing.  4. Massaging Aquaphor into the Area:  ---After one week, discontinue the use of bandages but continue to apply Aquaphor to the biopsy site. ----Gently massage the Aquaphor into the area using circular motions. ---Massaging the skin helps to promote circulation and prevent the formation of scar tissue.   Additional Tips:  Avoid exposing the biopsy site to direct sunlight during the healing process, as this can cause hyperpigmentation or worsen scarring. If you experience any signs of infection, such as increased redness, swelling, warmth, or drainage from the wound, contact your healthcare provider immediately. Follow any additional instructions provided by your healthcare provider for caring for the biopsy site and managing any discomfort. Conclusion:  Taking proper care of your skin biopsy site is crucial for ensuring optimal healing and minimizing scarring. By following these instructions for cleaning, applying Aquaphor, and massaging the area, you can promote a smooth and successful recovery. If you have any questions or concerns about caring for your biopsy site, don't hesitate to contact your  healthcare provider for guidance.    Cryotherapy Aftercare  Wash gently with soap and water everyday.   Apply Vaseline and Band-Aid daily until healed. Important Information   Due to recent changes in  healthcare laws, you may see results of your pathology and/or laboratory studies on MyChart before the doctors have had a chance to review them. We understand that in some cases there may be results that are confusing or concerning to you. Please understand that not all results are received at the same time and often the doctors may need to interpret multiple results in order to provide you with the best plan of care or course of treatment. Therefore, we ask that you please give [place] 2 business days to thoroughly review all your results before contacting the office for clarification. Should we see a critical lab result, you will be contacted sooner.     If You Need Anything After Your Visit   If you have any questions or concerns for your doctor, please call our main line at [phone]. If no one answers, please leave a voicemail as directed and we will return your call as soon as possible. Messages left after 4 pm will be answered the following business day.    You may also send [place] a message via MyChart. We typically respond to MyChart messages within 1-2 business days.  For prescription refills, please ask your pharmacy to contact our office. Our fax number is [phone].  If you have an urgent issue when the clinic is closed that cannot wait until the next business day, you can page your doctor at the number below.     Please note that while we do our best to be available for urgent issues outside of office hours, we are not available 24/7.    If you have an urgent issue and are unable to reach [place], you may choose to seek medical care at your doctor's office, retail clinic, urgent care center, or emergency room.   If you have a medical emergency, please immediately call 911 or go to the emergency department. In the event of inclement weather, please call our main line at [phone] for an update on the status of any delays or closures.  Dermatology Medication Tips: Please keep the boxes  that topical medications come in in order to help keep track of the instructions about where and how to use these. Pharmacies typically print the medication instructions only on the boxes and not directly on the medication tubes.   If your medication is too expensive, please contact our office at [phone] or send [place] a message through MyChart.    We are unable to tell what your co-pay for medications will be in advance as this is different depending on your insurance coverage. However, we may be able to find a substitute medication at lower cost or fill out paperwork to get insurance to cover a needed medication.    If a prior authorization is required to get your medication covered by your insurance company, please allow [place] 1-2 business days to complete this process.   Drug prices often vary depending on where the prescription is filled and some pharmacies may offer cheaper prices.   The website www.goodrx.com contains coupons for medications through different pharmacies. The prices here do not account for what the cost may be with help from insurance (it may be cheaper with your insurance), but the website can give you the price if you did not use any insurance.  -  You can print the associated coupon and take it with your prescription to the pharmacy.  - You may also stop by our office during regular business hours and pick up a GoodRx coupon card.  - If you need your prescription sent electronically to a different pharmacy, notify our office through [facility] or by phone at [phone]

## 2023-07-10 NOTE — Progress Notes (Signed)
New Patient Visit   Subjective  [name] is a 62 y.o. male who presents for the following: growth on face  Pt has a spot on the left side of his face for over a year. It sometimes itches. Spot doesn't bother him but hasn't gone away. His pcp was unsure of what it was so wanted him to have it checked out. Pt does not have a hx of skin cancer nor family hx of skin cancer.   The patient has spots, moles and lesions to be evaluated, some may be new or changing and the patient may have concern these could be cancer.   The following portions of the chart were reviewed this encounter and updated as appropriate: medications, allergies, medical history  Review of Systems:  No other skin or systemic complaints except as noted in HPI or Assessment and Plan.  Objective  Well appearing patient in no apparent distress; mood and affect are within normal limits.  A focused examination was performed of the following areas: Left side of face  Relevant exam findings are noted in the Assessment and Plan.  Exam: Pink patches with greasy scale at eyebrows and side of nose  Left Preauricular Area 6mm pink crusted papule       Scalp (17) Pink gritty papules     Assessment & Plan   1. Suspicious Lesion on Left Cheek    - Assessment: Shave biopsy performed to evaluate for hypertrophic actinic keratosis or squamous cell carcinoma.    - Plan: Patient instructed to apply Aquaphor to the biopsy site for two weeks.  2. Actinic Keratosis on Scalp    - Assessment: Diagnosis confirmed as actinic keratosis.    - Plan: Treated 18 lesions with cryotherapy. Advised to apply Aquaphor to the treated lesions for two weeks. Reevaluation and possible treatment with 5-fluorouracil cream scheduled for February.  3. Seborrheic Dermatitis    - Assessment: Erythematous greasy plaques noted on bilateral nasal crease, eyebrows, and beard area.    - Plan: Prescribed Nystatin-Triamcinolone cream.  Instructed to apply twice daily for up to seven days, ceasing earlier if rash clears. Advised not to use the cream for longer than seven days without a two-week break before reapplying for future flares.  Follow-up: - Scheduled for February to reevaluate actinic keratoses and consider treatment with 5-fluorouracil cream.  Neoplasm of uncertain behavior of skin Left Preauricular Area  Skin / nail biopsy Type of biopsy: tangential   Informed consent: discussed and consent obtained   Timeout: patient name, date of birth, surgical site, and procedure verified   Procedure prep:  Patient was prepped and draped in usual sterile fashion Prep type:  Isopropyl alcohol Anesthesia: the lesion was anesthetized in a standard fashion   Anesthetic:  1% lidocaine w/ epinephrine 1-100,000 buffered w/ 8.4% NaHCO3 Instrument used: DermaBlade   Hemostasis achieved with: aluminum chloride   Outcome: patient tolerated procedure well   Post-procedure details: sterile dressing applied and wound care instructions given   Dressing type: petrolatum gauze and bandage    Specimen 1 - Surgical pathology Differential Diagnosis: R/O SCC   Check Margins: No  AK (actinic keratosis) (17) Scalp  Destruction of lesion - Scalp (17) Complexity: simple   Destruction method: cryotherapy   Informed consent: discussed and consent obtained   Timeout:  patient name, date of birth, surgical site, and procedure verified Lesion destroyed using liquid nitrogen: Yes   Region frozen until ice ball extended beyond lesion: Yes   Cryotherapy cycles:  2 Outcome: patient tolerated procedure well with no complications   Post-procedure details: wound care instructions given      Return in about 5 months (around 12/10/2023) for TBSE.  Owens Shark, CMA, am acting as scribe for [employer], DO.   Documentation: I have reviewed the above documentation for accuracy and completeness, and I agree with the above.  Langston Reusing, DO

## 2023-07-12 LAB — SURGICAL PATHOLOGY

## 2023-07-17 NOTE — Progress Notes (Signed)
Please call patient and review results and refer for Mohs with Dr Caralyn Guile for positive skin cancer(s)  Thanks!  FINAL DIAGNOSIS        1. Skin, left preauricular area :       WELL DIFFERENTIATED SQUAMOUS CELL CARCINOMA WITH SUPERFICIAL INFILTRATION

## 2023-07-19 ENCOUNTER — Telehealth: Payer: Self-pay

## 2023-07-19 NOTE — Telephone Encounter (Signed)
Advised patient of results and will refer to Dr Caralyn Guile for Mohs/hd

## 2023-07-19 NOTE — Telephone Encounter (Signed)
-----   Message from Langston Reusing sent at 07/17/2023  1:04 PM EDT ----- Please call patient and review results and refer for Mohs with Dr Caralyn Guile for positive skin cancer(s)  Thanks!  FINAL DIAGNOSIS        1. Skin, left preauricular area :       WELL DIFFERENTIATED SQUAMOUS CELL CARCINOMA WITH SUPERFICIAL INFILTRATION

## 2023-07-20 ENCOUNTER — Telehealth: Payer: Self-pay | Admitting: Dermatology

## 2023-07-20 NOTE — Telephone Encounter (Signed)
Patient has questions about a voicemail that was left about his results , please reach out to patient .

## 2023-07-23 ENCOUNTER — Other Ambulatory Visit: Payer: Self-pay

## 2023-07-23 DIAGNOSIS — C4492 Squamous cell carcinoma of skin, unspecified: Secondary | ICD-10-CM

## 2023-08-13 ENCOUNTER — Ambulatory Visit (INDEPENDENT_AMBULATORY_CARE_PROVIDER_SITE_OTHER): Payer: BC Managed Care – PPO | Admitting: Orthopaedic Surgery

## 2023-08-13 ENCOUNTER — Encounter: Payer: Self-pay | Admitting: Orthopaedic Surgery

## 2023-08-13 ENCOUNTER — Other Ambulatory Visit (INDEPENDENT_AMBULATORY_CARE_PROVIDER_SITE_OTHER): Payer: BC Managed Care – PPO

## 2023-08-13 DIAGNOSIS — Z96652 Presence of left artificial knee joint: Secondary | ICD-10-CM | POA: Diagnosis not present

## 2023-08-13 NOTE — Progress Notes (Signed)
The patient is a 62 year old gentleman who is almost a year out from a left total knee arthroplasty.  We did this with press-fit implants.  He says he is doing well overall and he really cannot squat but overall he is doing shots and basketball and ran just a little bit.  He reports good range of motion and strength.  I agree that he has excellent range of motion of that left knee.  Feels stable ligamentously.  There is no swelling.  2 views of the left knee show well-seated press-fit total knee arthroplasty with no complicating features.  This point follow-up can be as needed for his left knee.  If he does develop any issues at all he knows to contact us and let us know.  All questions and concerns were addressed and answered.

## 2023-08-15 ENCOUNTER — Encounter: Payer: Self-pay | Admitting: Dermatology

## 2023-08-15 ENCOUNTER — Ambulatory Visit: Payer: BC Managed Care – PPO | Admitting: Dermatology

## 2023-08-15 VITALS — BP 132/64 | HR 76 | Temp 98.2°F

## 2023-08-15 DIAGNOSIS — C4492 Squamous cell carcinoma of skin, unspecified: Secondary | ICD-10-CM

## 2023-08-15 DIAGNOSIS — L570 Actinic keratosis: Secondary | ICD-10-CM | POA: Diagnosis not present

## 2023-08-15 DIAGNOSIS — C44320 Squamous cell carcinoma of skin of unspecified parts of face: Secondary | ICD-10-CM | POA: Diagnosis not present

## 2023-08-15 DIAGNOSIS — L814 Other melanin hyperpigmentation: Secondary | ICD-10-CM

## 2023-08-15 NOTE — Patient Instructions (Signed)

## 2023-08-15 NOTE — Progress Notes (Signed)
Follow-Up Visit   Subjective  [name] is a 62 y.o. male who presents for the following: Mohs of Well Differentiated SCC of the left preauricular area  The following portions of the chart were reviewed this encounter and updated as appropriate: medications, allergies, medical history  Review of Systems:  No other skin or systemic complaints except as noted in HPI or Assessment and Plan.  Objective  Well appearing patient in no apparent distress; mood and affect are within normal limits.  A focused examination was performed of the following areas: Left preauricular area Relevant physical exam findings are noted in the Assessment and Plan.   Left Preauricular Area            Assessment & Plan   Squamous cell carcinoma of skin Left Preauricular Area  Mohs surgery  Consent obtained: written  Anticoagulation: Is the patient taking prescription anticoagulant and/or aspirin prescribed/recommended by a physician? No   Was the anticoagulation regimen changed prior to Mohs? No    Procedure Details: Surgical site (from skin exam): Left Preauricular Area Pre-operative length (cm): 1.3 Pre-operative width (cm): 1.5  Micrographic Surgery Details: Post-operative length (cm): 1.6 Post-operative width (cm): 1.8 Number of Mohs stages: 1  Skin repair Complexity:  Complex Final length (cm):  4.8 Fine/surface layer approximation (top stitches):  Hemostasis achieved with: suture Hemostasis achieved with comment:  4.0 monycryl, 5.0 fast absorbing    Return in about 4 weeks (around 09/12/2023) for wound check.  Owens Shark, CMA, am acting as scribe for Gwenith Daily, MD.    08/15/2023  HISTORY OF PRESENT ILLNESS  [name] is seen in consultation at the request of Dr. Langston Reusing for biopsy-proven Well Differentiated Squamous Cell. They note that the area has been present for about 2-3 months increasing in size with tenderness.  There is no  history of previous treatment.  Reports no other new or changing lesions and has no other complaints today.  Medications and allergies: see patient chart.  Review of systems: Reviewed 8 systems and notable for the above skin cancer.  All other systems reviewed are unremarkable/negative, unless noted in the HPI. Past medical history, surgical history, family history, social history were also reviewed and are noted in the chart/questionnaire.    PHYSICAL EXAMINATION  General: Well-appearing, in no acute distress, alert and oriented x 4. Vitals reviewed in chart (if available).   Skin: Exam reveals a 1.3 x 1.5 cm erythematous papule and biopsy scar on the left preauricular region. There are rhytids, telangiectasias, and lentigines, consistent with photodamage.   Biopsy report(s) reviewed, confirming the diagnosis.   ASSESSMENT  1) Well Differentiated Squamous Cell Carcinoma on the left preauricular region 2) photodamage 3) solar lentigines   PLAN   1. Due to location, size, histology, or recurrence and the likelihood of subclinical extension as well as the need to conserve normal surrounding tissue, the patient was deemed acceptable for Mohs micrographic surgery (MMS).  The nature and purpose of the procedure, associated benefits and risks including recurrence and scarring, possible complications such as pain, infection, and bleeding, and alternative methods of treatment if appropriate were discussed with the patient during consent. The lesion location was verified by the patient, by reviewing previous notes, pathology reports, and by photographs as well as angulation measurements if available.  Informed consent was reviewed and signed by the patient, and timeout was performed at 8:45 AM. See op note below.  2. For the photodamage and solar lentigines, sun  protection discussed/information given on OTC sunscreens, and we recommend continued regular follow-up with primary dermatologist every 6  months or sooner for any growing, bleeding, or changing lesions. 3. Prognosis and future surveillance discussed. 4. Letter with treatment outcome sent to referring provider. 5. Pain acetaminophen/ibuprofen   MOHS MICROGRAPHIC SURGERY AND RECONSTRUCTION  Initial size:   1.3 x 1.5 cm Surgical defect/wound size: 1.6 x 1.8 cm Anesthesia:    0.33% lidocaine with 1:200,000 epinephrine EBL:    <5 mL Complications:  None Repair type:   Complex SQ suture:   5-0 Monocryl Cutaneous suture:  5-0 Fast Absorbing Gut Final size of the repair: 4.8 cm  Stages: 1  STAGE I: Anesthesia achieved with 0.5% lidocaine with 1:200,000 epinephrine. ChloraPrep applied. 1 section(s) excised using Mohs technique (this includes total peripheral and deep tissue margin excision and evaluation with frozen sections, excised and interpreted by the same physician). The tumor was first debulked and then excised with an approx. 2mm margin.  Hemostasis was achieved with electrocautery as needed.  The specimen was then oriented, subdivided/relaxed, inked, and processed using Mohs technique.    Frozen section analysis revealed a clear deep and peripheral margin.  Reconstruction  The surgical wound was then cleaned, prepped, and re-anesthetized as above. Wound edges were undermined extensively along at least one entire edge and at a distance equal to or greater than the width of the defect (see wound defect size above) in order to achieve closure and decrease wound tension and anatomic distortion. Redundant tissue repair including standing cone removal was performed. Hemostasis was achieved with electrocautery. Subcutaneous and epidermal tissues were approximated with the above sutures. The surgical site was then lightly scrubbed with sterile, saline-soaked gauze. Steri-strips were applied, and the area was then bandaged using Vaseline ointment, non-adherent gauze, gauze pads, and tape to provide an adequate pressure dressing. The  patient tolerated the procedure well, was given detailed written and verbal wound care instructions, and was discharged in good condition.   The patient will follow-up: 4 weeks.   Documentation: I have reviewed the above documentation for accuracy and completeness, and I agree with the above.  Gwenith Daily, MD

## 2023-08-16 ENCOUNTER — Encounter: Payer: Self-pay | Admitting: Dermatology

## 2023-09-12 ENCOUNTER — Ambulatory Visit: Payer: BC Managed Care – PPO | Admitting: Dermatology

## 2023-09-12 ENCOUNTER — Encounter: Payer: Self-pay | Admitting: Dermatology

## 2023-09-12 VITALS — BP 117/71

## 2023-09-12 DIAGNOSIS — L905 Scar conditions and fibrosis of skin: Secondary | ICD-10-CM

## 2023-09-12 DIAGNOSIS — Z85828 Personal history of other malignant neoplasm of skin: Secondary | ICD-10-CM

## 2023-09-12 DIAGNOSIS — C4492 Squamous cell carcinoma of skin, unspecified: Secondary | ICD-10-CM

## 2023-09-12 NOTE — Patient Instructions (Signed)

## 2023-09-12 NOTE — Progress Notes (Signed)
   Follow-Up Visit   Subjective  [name] is a 62 y.o. male who presents for the following: SCC follow up - Mohs 08/15/2023   The following portions of the chart were reviewed this encounter and updated as appropriate: medications, allergies, medical history  Review of Systems:  No other skin or systemic complaints except as noted in HPI or Assessment and Plan.  Objective  Well appearing patient in no apparent distress; mood and affect are within normal limits.   A focused examination was performed of the following areas:   Relevant exam findings are noted in the Assessment and Plan.   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  - Call if any new or changing lesions are noted between office visits - Reassured that lesion is well healed, discussed that scars take upto 12 months to mature      Return for Follow up as scheduled, TBSE with Dr Onalee Hua.  I, Joanie Coddington, CMA, am acting as scribe for Gwenith Daily, MD .   Documentation: I have reviewed the above documentation for accuracy and completeness, and I agree with the above.  Gwenith Daily, MD

## 2023-12-10 ENCOUNTER — Ambulatory Visit: Payer: BC Managed Care – PPO | Admitting: Dermatology

## 2024-03-12 ENCOUNTER — Ambulatory Visit: Payer: BC Managed Care – PPO | Admitting: Dermatology

## 2024-07-31 ENCOUNTER — Other Ambulatory Visit: Payer: Self-pay | Admitting: Orthopedic Surgery

## 2024-07-31 DIAGNOSIS — M4807 Spinal stenosis, lumbosacral region: Secondary | ICD-10-CM

## 2024-08-05 ENCOUNTER — Encounter: Payer: Self-pay | Admitting: Orthopedic Surgery

## 2024-08-16 ENCOUNTER — Other Ambulatory Visit

## 2024-08-23 ENCOUNTER — Other Ambulatory Visit

## 2024-08-23 ENCOUNTER — Ambulatory Visit
Admission: RE | Admit: 2024-08-23 | Discharge: 2024-08-23 | Disposition: A | Source: Ambulatory Visit | Attending: Orthopedic Surgery | Admitting: Orthopedic Surgery

## 2024-08-23 DIAGNOSIS — M4807 Spinal stenosis, lumbosacral region: Secondary | ICD-10-CM

## 2024-08-28 ENCOUNTER — Encounter: Payer: Self-pay | Admitting: Orthopedic Surgery

## 2024-09-03 ENCOUNTER — Other Ambulatory Visit (INDEPENDENT_AMBULATORY_CARE_PROVIDER_SITE_OTHER)

## 2024-09-03 ENCOUNTER — Ambulatory Visit (INDEPENDENT_AMBULATORY_CARE_PROVIDER_SITE_OTHER): Admitting: Orthopaedic Surgery

## 2024-09-03 ENCOUNTER — Encounter: Payer: Self-pay | Admitting: Orthopaedic Surgery

## 2024-09-03 DIAGNOSIS — M25562 Pain in left knee: Secondary | ICD-10-CM

## 2024-09-03 DIAGNOSIS — M5432 Sciatica, left side: Secondary | ICD-10-CM

## 2024-09-03 NOTE — Progress Notes (Signed)
 The patient is now 2 years out from press-fit left total knee arthroplasty.  He says occasionally gets some swelling in that knee but the knee is doing well overall other than some swelling.  He was actually sent to me today from his primary care physician because he does have an MRI of his lumbar spine showing some degenerative changes at several levels.  He has already been through physical therapy for his spine.  He reports left-sided sciatica it does radiate down into his foot.  This has been going on for a while now.  It is worse when he is standing and active.  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  The MRI of his lumbar spine shows mild central stenosis at L4-L5 with mild bilateral facet hypertrophy.  There is also multilevel degenerative changes at several levels but no gross compression of nerves.  On exam his pain is in the lower aspect the lumbar spine only to the left side at the facet joints of the lower lumbar spine.  He does radiate into the sciatic area down to his foot.  He has excellent strength in his left lower extremity.  His left knee she has some slight swelling but good range of motion overall and he feels ligamentously stable.  2 views of the left knee show well-seated press-fit total knee arthroplasty with no complicating features.  I would like to send him to Dr. Eldonna since he has already had physical therapy on his back and this is not helping.  I think it is worth considering a left sided L4-L5 facet injection versus an ESI depending on the patient's symptoms and what Dr. Eldonna sees on the MRI as well.  The patient does agree with this treatment plan.

## 2024-09-04 ENCOUNTER — Other Ambulatory Visit: Payer: Self-pay

## 2024-09-04 ENCOUNTER — Telehealth: Payer: Self-pay | Admitting: Physical Medicine and Rehabilitation

## 2024-09-04 ENCOUNTER — Telehealth: Payer: Self-pay | Admitting: Orthopaedic Surgery

## 2024-09-04 DIAGNOSIS — M5432 Sciatica, left side: Secondary | ICD-10-CM

## 2024-09-04 NOTE — Telephone Encounter (Signed)
 Pt request a call back to discuss the referral from [facility] to see [facility]. Pt states he have question before getting the procedure auth and scheduled.

## 2024-09-04 NOTE — Telephone Encounter (Signed)
 Pt called saying that Dr. Vernetta referred him to Dr. Eldonna. Says Dr. Eldonna was supposed to call him back today,but he hasn't  heard anything as of yet. Pt call back number is [phone]

## 2024-09-05 ENCOUNTER — Telehealth: Payer: Self-pay

## 2024-09-05 NOTE — Telephone Encounter (Signed)
 Spoke to patient and answered questions within guidelines.SABRA Knee also assisted in answering question about the process of injections.

## 2024-09-05 NOTE — Telephone Encounter (Signed)
 Please look over patients request, he has already been approved at another facility, OV with Megan is already scheduled.

## 2024-09-29 ENCOUNTER — Ambulatory Visit: Admitting: Dermatology

## 2024-10-01 ENCOUNTER — Ambulatory Visit: Admitting: Physical Medicine and Rehabilitation

## 2024-10-01 ENCOUNTER — Encounter: Payer: Self-pay | Admitting: Physical Medicine and Rehabilitation

## 2024-10-01 DIAGNOSIS — M48061 Spinal stenosis, lumbar region without neurogenic claudication: Secondary | ICD-10-CM | POA: Diagnosis not present

## 2024-10-01 DIAGNOSIS — R202 Paresthesia of skin: Secondary | ICD-10-CM

## 2024-10-01 DIAGNOSIS — M5442 Lumbago with sciatica, left side: Secondary | ICD-10-CM

## 2024-10-01 DIAGNOSIS — G8929 Other chronic pain: Secondary | ICD-10-CM | POA: Diagnosis not present

## 2024-10-01 NOTE — Progress Notes (Unsigned)
 Pain Scale   Average Pain 2 Patient advising he has lower back pain radiating to left hip area.        +Driver, -BT, -Dye Allergies.

## 2024-10-01 NOTE — Telephone Encounter (Signed)
 Patient saw Duwaine this morning and she told her the MRI shows more on the right side and he is having so much problem on the left. He was wanting to talk to you but I told him that was not possible as you were alittle behind this morning. He is worried the foot numbness/leg pain is coming from surgery and want you to call him

## 2024-10-01 NOTE — Progress Notes (Unsigned)
 [name] - 63 y.o. male MRN [id number]  Date of birth: 1961/03/15  Office Visit Note: Visit Date: 10/01/2024 [facility]: [facility]erred by: [facility]  Subjective: Chief Complaint  Patient presents with   Lower Back - Pain   HPI: [name] is a 63 y.o. male who comes in today per the request of Dr. Lonni Poli for evaluation of chronic, worsening and severe bilateral lower back pain radiating to left lateral hip. Also reports numbness/tingling to left foot. Patient is being seen in the office today prior to injection as he has several questions regarding his lower back pain/left foot numbness and injection procedure. Pain ongoing for about 1 year, most severe pain in the morning after waking up. His pain worsens with movement and activity. He describes pain as sharp and throbbing sensation, currently rates as 5 out of 10. Some relief of pain with home exercise regimen, rest and use of medications. History of formal physical therapy with minimal relief of pain.   Recent lumbar MRI imaging shows multi level degenerative changes, mild central spinal canal stenosis, mild bilateral facet hypertrophy with mild bilateral lateral recess stenosis at L4-L5. There is lateral recess narrowing on the right greater than left at L2-L3. We placed order for L4-L5 interlaminar epidural steroid injection on 09/04/2024, however patient elected to hold on injection at that time. 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 Patient denies focal weakness. No recent trauma or falls.    He has upcoming appointment with Dr. Deatrice Manus at [facility] on 10/22/2024. He would like to get second opinion prior to moving forward with any interventional procedures. He is concerned the injections would only provide temporary relief and he is looking for more permanent solution to help with his symptoms.       Review of Systems   Musculoskeletal:  Positive for back pain.  Neurological:  Positive for tingling. Negative for focal weakness and weakness.  All other systems reviewed and are negative.  Otherwise per HPI.  Assessment & Plan: Visit Diagnoses:    ICD-10-CM   1. Chronic bilateral low back pain with left-sided sciatica  G89.29    M54.42     2. Stenosis of lateral recess of lumbar spine  M48.061     3. Paresthesia of skin  R20.2        Plan: Findings:  Chronic, worsening and severe bilateral lower back pain radiating to left lateral hip. Also reports paresthesias to left foot. Patient continues to have severe pain despite good conservative therapies such as home exercise regimen, rest and use of medications.   I spent a great deal of time discussing recent lumbar MRI imaging and lumbar injection procedure. I explained to him that majority of findings on MRI were actually on the right side. There is lateral recess narrowing at L2-L3 greater on the left that could cause buttock/lateral hip pain. No great explanation for chronic left foot paresthesias. No high grade spinal canal stenosis. There is also facet arthropathy that could cause more axial back pain. I do not see any overtly surgical findings on MRI imaging. I discussed injection procedure in detail including risks of injection such as localized bleeding and infection. I also explained to him that injections are diagnostic in nature, we hope for greater than 50% relief of pain for several months. He is very hesitant to move forward with injection procedure, states his friends told him this procedure was terrible.  He is more interested in permanent solution to his pain. He has consult with Dr. Deatrice Manus at [facility] in the coming weeks. He is free to get a second option, can also consult with surgeon at this practice.  His exam today is non focal, good strength noted to bilateral lower extremities. Mild pain noted with facet loading and lumbar  extension. Should he return to the office would consider left L4-L5 interlaminar epidural steroid injection. Would also consider facet joint injections if more axial lower back pain. He can continue to follow up with Dr. Vernetta from orthopedic standpoint. Would recommend pre-procedure sedation prior to injection.     Meds & Orders: No orders of the defined types were placed in this encounter.  No orders of the defined types were placed in this encounter.   Follow-up: Return if symptoms worsen or fail to improve.   Procedures: No procedures performed      Clinical History: EXAM: MRI LUMBAR SPINE 08/23/2024 12:59:22 PM   TECHNIQUE: Multiplanar multisequence MRI of the lumbar spine was performed without the administration of intravenous contrast.   COMPARISON: Lumbar spine series dated 12/13/2021.   CLINICAL HISTORY: Chronic bilateral lumbar spinal stenosis with radiculopathy left greater than right. Numbness and pain radiating into the plantar aspect of the foot. Pain for several years, progressively increasing the last 9 months.   FINDINGS:   BONES AND ALIGNMENT: Mild rotatory dextroscoliosis of the thoracolumbar spine. The lumbar vertebrae maintain their height. There are chronic Schmorl's nodes present within the vertebral endplates at multiple levels. Bone marrow signal is unremarkable.   SPINAL CORD: The conus medullaris terminates at the lower body of L1.   SOFT TISSUES: No paraspinal mass.   L1-L2: Diffuse disc bulging and endplate ridging, causing mild central spinal canal stenosis and mild bilateral lateral recess stenosis.   L2-L3: Broad-based disc bulging and endplate ridging resulting in mild central spinal canal stenosis bilateral lateral recess stenosis.   L3-L4: Diffuse disc bulging and endplate ridging with mild central spinal canal stenosis and bilateral lateral recess stenosis.   L4-L5: Disc space narrowing and mild bilateral facet hypertrophy  with mild bilateral lateral recess stenosis. There is also mild central spinal canal stenosis.   L5-S1: Minimal disc bulging. There is no significant central spinal canal stenosis. There is mild bilateral neural foraminal stenosis.   IMPRESSION: 1. Mild rotatory dextroscoliosis of the thoracolumbar spine. 2. Multilevel degenerative changes with mild central spinal canal and bilateral lateral recess stenosis at L1-2, L2-3, and L3-4. 3. Mild central spinal canal stenosis and mild bilateral facet hypertrophy with mild bilateral lateral recess stenosis at L4-5. Um   Electronically signed by: Evalene Coho MD 08/28/2024 08:05 AM EST RP Workstation: [id number]   He reports that he has never smoked. He has never used smokeless tobacco. No results for input(s): HGBA1C, LABURIC in the last 8760 hours.  Objective:  VS:  HT:    WT:   BMI:     BP:   HR: bpm  TEMP: ( )  RESP:  Physical Exam Vitals and nursing note reviewed.  HENT:     Head: Normocephalic and atraumatic.     Right Ear: External ear normal.     Left Ear: External ear normal.     Nose: Nose normal.     Mouth/Throat:     Mouth: Mucous membranes are moist.  Eyes:     Extraocular Movements: Extraocular movements intact.  Cardiovascular:     Rate and Rhythm: Normal rate.  Pulses: Normal pulses.  Pulmonary:     Effort: Pulmonary effort is normal.  Abdominal:     General: Abdomen is flat. There is no distension.  Musculoskeletal:        General: Tenderness present.     Cervical back: Normal range of motion.     Comments: Patient rises from seated position to standing without difficulty. Mild pain noted with facet loading and lumbar extension. 5/5 strength noted with bilateral hip flexion, knee flexion/extension, ankle dorsiflexion/plantarflexion and EHL. No clonus noted bilaterally. No pain upon palpation of greater trochanters. No pain with internal/external rotation of bilateral hips. Sensation intact  bilaterally. Negative slump test bilaterally. Ambulates without aid, gait steady.     Skin:    General: Skin is warm and dry.     Capillary Refill: Capillary refill takes less than 2 seconds.  Neurological:     General: No focal deficit present.     Mental Status: He is alert and oriented to person, place, and time.  Psychiatric:        Mood and Affect: Mood normal.        Behavior: Behavior normal.     Ortho Exam  Imaging: No results found.  Past Medical/Family/Surgical/Social History: Medications & Allergies reviewed per EMR, new medications updated. Patient Active Problem List   Diagnosis Date Noted   Status post total left knee replacement 08/17/2022   Unilateral primary osteoarthritis, left knee 07/19/2022   Restless leg syndrome 12/18/2016   DDD (degenerative disc disease), lumbar 11/17/2016   Infertility male 11/17/2016   Chronic pain of left knee 11/17/2016   Gastroesophageal reflux disease without esophagitis 01/17/2016   Fatty liver disease, nonalcoholic 01/17/2016   Gynecomastia 01/17/2016   Skin lesion 01/17/2016   Hydrocele, left 09/08/2015   Age-related macular degeneration 04/15/2015   Endophthalmitis, left eye 03/02/2015   Cystoid macular edema 02/09/2012   Amblyopia 12/28/2011   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   Past Medical History:  Diagnosis Date   Arthritis    Complication of anesthesia    Fatty liver disease, nonalcoholic    GERD (gastroesophageal reflux disease)    Hyperlipidemia    Hypertension    Low back pain    PONV (postoperative nausea and vomiting)    Sacroiliac pain    Squamous cell carcinoma of skin 07/10/2023   Left preauricular - refer for Mohs   Family History  Problem Relation Age of Onset   Asthma Mother    Arthritis Mother    Diabetes Maternal Aunt    Past Surgical History:  Procedure Laterality Date   COLONOSCOPY     never as of 12/2015   FOOT SURGERY     JOINT REPLACEMENT  2007, [id number]    WRIST , KNEE, FOOT   KNEE SURGERY     bilateral   TOTAL KNEE ARTHROPLASTY Left 08/17/2022   Procedure: LEFT TOTAL KNEE ARTHROPLASTY;  Surgeon: Vernetta Lonni GRADE, MD;  Location: [facility];  Service: Orthopedics;  Laterality: Left;   WRIST SURGERY     Social History   Occupational History   Not on file  Tobacco Use   Smoking status: Never   Smokeless tobacco: Never  Vaping Use   Vaping status: Never Used  Substance and Sexual Activity   Alcohol use: Yes    Comment: socially   Drug use: No   Sexual activity: Yes

## 2024-10-13 ENCOUNTER — Ambulatory Visit: Admitting: Physical Medicine and Rehabilitation
